# Patient Record
Sex: Female | Born: 1950 | Race: White | Hispanic: No | Marital: Married | Smoking: Never smoker
Health system: Southern US, Community
[De-identification: ages and names within clinical notes are randomized; demographics above are authoritative.]

## PROBLEM LIST (undated history)

## (undated) DIAGNOSIS — K219 Gastro-esophageal reflux disease without esophagitis: Secondary | ICD-10-CM

## (undated) DIAGNOSIS — T7840XA Allergy, unspecified, initial encounter: Secondary | ICD-10-CM

## (undated) DIAGNOSIS — K5790 Diverticulosis of intestine, part unspecified, without perforation or abscess without bleeding: Secondary | ICD-10-CM

## (undated) DIAGNOSIS — K635 Polyp of colon: Secondary | ICD-10-CM

## (undated) DIAGNOSIS — E669 Obesity, unspecified: Secondary | ICD-10-CM

## (undated) DIAGNOSIS — E785 Hyperlipidemia, unspecified: Secondary | ICD-10-CM

## (undated) HISTORY — DX: Allergy, unspecified, initial encounter: T78.40XA

## (undated) HISTORY — PX: COLONOSCOPY: SHX174

## (undated) HISTORY — DX: Diverticulosis of intestine, part unspecified, without perforation or abscess without bleeding: K57.90

## (undated) HISTORY — DX: Hyperlipidemia, unspecified: E78.5

## (undated) HISTORY — DX: Obesity, unspecified: E66.9

## (undated) HISTORY — DX: Polyp of colon: K63.5

## (undated) HISTORY — PX: CHOLECYSTECTOMY: SHX55

## (undated) HISTORY — PX: THYROID CYST EXCISION: SHX2511

## (undated) HISTORY — DX: Gastro-esophageal reflux disease without esophagitis: K21.9

---

## 2005-05-06 ENCOUNTER — Ambulatory Visit: Payer: Self-pay | Admitting: Family Medicine

## 2005-08-11 ENCOUNTER — Ambulatory Visit: Payer: Self-pay | Admitting: Family Medicine

## 2005-08-14 ENCOUNTER — Ambulatory Visit: Payer: Self-pay | Admitting: Family Medicine

## 2005-10-01 ENCOUNTER — Ambulatory Visit (HOSPITAL_COMMUNITY): Admission: RE | Admit: 2005-10-01 | Discharge: 2005-10-01 | Payer: Self-pay | Admitting: Obstetrics and Gynecology

## 2006-09-21 ENCOUNTER — Ambulatory Visit: Payer: Self-pay | Admitting: Family Medicine

## 2006-10-05 ENCOUNTER — Ambulatory Visit (HOSPITAL_COMMUNITY): Admission: RE | Admit: 2006-10-05 | Discharge: 2006-10-05 | Payer: Self-pay | Admitting: Obstetrics and Gynecology

## 2006-10-20 ENCOUNTER — Encounter: Admission: RE | Admit: 2006-10-20 | Discharge: 2006-10-20 | Payer: Self-pay | Admitting: Obstetrics and Gynecology

## 2007-10-10 ENCOUNTER — Ambulatory Visit (HOSPITAL_COMMUNITY): Admission: RE | Admit: 2007-10-10 | Discharge: 2007-10-10 | Payer: Self-pay | Admitting: Obstetrics and Gynecology

## 2008-10-11 ENCOUNTER — Ambulatory Visit (HOSPITAL_COMMUNITY): Admission: RE | Admit: 2008-10-11 | Discharge: 2008-10-11 | Payer: Self-pay | Admitting: Obstetrics and Gynecology

## 2009-11-11 ENCOUNTER — Ambulatory Visit (HOSPITAL_COMMUNITY): Admission: RE | Admit: 2009-11-11 | Discharge: 2009-11-11 | Payer: Self-pay | Admitting: Obstetrics and Gynecology

## 2010-11-16 ENCOUNTER — Encounter: Payer: Self-pay | Admitting: Obstetrics and Gynecology

## 2010-11-21 ENCOUNTER — Other Ambulatory Visit (HOSPITAL_COMMUNITY): Payer: Self-pay | Admitting: Family Medicine

## 2010-11-21 DIAGNOSIS — Z0000 Encounter for general adult medical examination without abnormal findings: Secondary | ICD-10-CM

## 2010-12-02 ENCOUNTER — Ambulatory Visit (HOSPITAL_COMMUNITY)
Admission: RE | Admit: 2010-12-02 | Discharge: 2010-12-02 | Disposition: A | Discharge status: Home / Self Care | Source: Ambulatory Visit | Attending: Family Medicine | Admitting: Family Medicine

## 2010-12-02 DIAGNOSIS — Z1231 Encounter for screening mammogram for malignant neoplasm of breast: Secondary | ICD-10-CM | POA: Insufficient documentation

## 2010-12-02 DIAGNOSIS — Z0000 Encounter for general adult medical examination without abnormal findings: Secondary | ICD-10-CM

## 2011-12-14 ENCOUNTER — Other Ambulatory Visit (HOSPITAL_COMMUNITY): Payer: Self-pay | Admitting: Family Medicine

## 2011-12-14 DIAGNOSIS — Z1231 Encounter for screening mammogram for malignant neoplasm of breast: Secondary | ICD-10-CM

## 2012-01-07 ENCOUNTER — Ambulatory Visit (HOSPITAL_COMMUNITY)
Admission: RE | Admit: 2012-01-07 | Discharge: 2012-01-07 | Disposition: A | Discharge status: Home / Self Care | Source: Ambulatory Visit | Attending: Family Medicine | Admitting: Family Medicine

## 2012-01-07 DIAGNOSIS — Z1231 Encounter for screening mammogram for malignant neoplasm of breast: Secondary | ICD-10-CM

## 2013-02-20 ENCOUNTER — Other Ambulatory Visit (HOSPITAL_COMMUNITY): Payer: Self-pay | Admitting: Family Medicine

## 2013-02-20 DIAGNOSIS — Z1231 Encounter for screening mammogram for malignant neoplasm of breast: Secondary | ICD-10-CM

## 2013-03-02 ENCOUNTER — Ambulatory Visit (HOSPITAL_COMMUNITY): Payer: PRIVATE HEALTH INSURANCE

## 2013-03-03 ENCOUNTER — Ambulatory Visit (HOSPITAL_COMMUNITY)
Admission: RE | Admit: 2013-03-03 | Discharge: 2013-03-03 | Disposition: A | Discharge status: Home / Self Care | Source: Ambulatory Visit | Attending: Family Medicine | Admitting: Family Medicine

## 2013-03-03 DIAGNOSIS — Z1231 Encounter for screening mammogram for malignant neoplasm of breast: Secondary | ICD-10-CM | POA: Insufficient documentation

## 2013-03-06 ENCOUNTER — Other Ambulatory Visit: Payer: Self-pay | Admitting: Family Medicine

## 2013-03-06 DIAGNOSIS — R928 Other abnormal and inconclusive findings on diagnostic imaging of breast: Secondary | ICD-10-CM

## 2013-03-16 ENCOUNTER — Ambulatory Visit
Admission: RE | Admit: 2013-03-16 | Discharge: 2013-03-16 | Disposition: A | Discharge status: Home / Self Care | Payer: PRIVATE HEALTH INSURANCE | Source: Ambulatory Visit | Attending: Family Medicine | Admitting: Family Medicine

## 2013-03-16 DIAGNOSIS — R928 Other abnormal and inconclusive findings on diagnostic imaging of breast: Secondary | ICD-10-CM

## 2013-08-16 ENCOUNTER — Other Ambulatory Visit: Payer: Self-pay | Admitting: Family Medicine

## 2013-08-16 DIAGNOSIS — N6310 Unspecified lump in the right breast, unspecified quadrant: Secondary | ICD-10-CM

## 2013-09-18 ENCOUNTER — Ambulatory Visit
Admission: RE | Admit: 2013-09-18 | Discharge: 2013-09-18 | Disposition: A | Discharge status: Home / Self Care | Source: Ambulatory Visit | Attending: Family Medicine | Admitting: Family Medicine

## 2013-09-18 DIAGNOSIS — N6310 Unspecified lump in the right breast, unspecified quadrant: Secondary | ICD-10-CM

## 2014-02-28 ENCOUNTER — Other Ambulatory Visit: Payer: Self-pay | Admitting: Family Medicine

## 2014-02-28 DIAGNOSIS — N6310 Unspecified lump in the right breast, unspecified quadrant: Secondary | ICD-10-CM

## 2014-03-12 ENCOUNTER — Ambulatory Visit
Admission: RE | Admit: 2014-03-12 | Discharge: 2014-03-12 | Disposition: A | Discharge status: Home / Self Care | Source: Ambulatory Visit | Attending: Family Medicine | Admitting: Family Medicine

## 2014-03-12 ENCOUNTER — Encounter (INDEPENDENT_AMBULATORY_CARE_PROVIDER_SITE_OTHER): Payer: Self-pay

## 2014-03-12 DIAGNOSIS — N6310 Unspecified lump in the right breast, unspecified quadrant: Secondary | ICD-10-CM

## 2015-04-22 ENCOUNTER — Other Ambulatory Visit: Payer: Self-pay

## 2015-04-22 DIAGNOSIS — Z1231 Encounter for screening mammogram for malignant neoplasm of breast: Secondary | ICD-10-CM

## 2015-05-01 ENCOUNTER — Ambulatory Visit
Admission: RE | Admit: 2015-05-01 | Discharge: 2015-05-01 | Disposition: A | Discharge status: Home / Self Care | Source: Ambulatory Visit

## 2015-05-01 DIAGNOSIS — Z1231 Encounter for screening mammogram for malignant neoplasm of breast: Secondary | ICD-10-CM

## 2015-07-15 DIAGNOSIS — E785 Hyperlipidemia, unspecified: Secondary | ICD-10-CM | POA: Insufficient documentation

## 2015-07-15 DIAGNOSIS — E010 Iodine-deficiency related diffuse (endemic) goiter: Secondary | ICD-10-CM | POA: Insufficient documentation

## 2015-07-15 DIAGNOSIS — E669 Obesity, unspecified: Secondary | ICD-10-CM | POA: Insufficient documentation

## 2015-07-15 DIAGNOSIS — R7309 Other abnormal glucose: Secondary | ICD-10-CM | POA: Insufficient documentation

## 2016-08-28 ENCOUNTER — Other Ambulatory Visit: Payer: Self-pay | Admitting: Family Medicine

## 2016-08-28 DIAGNOSIS — Z1231 Encounter for screening mammogram for malignant neoplasm of breast: Secondary | ICD-10-CM

## 2016-09-28 ENCOUNTER — Ambulatory Visit
Admission: RE | Admit: 2016-09-28 | Discharge: 2016-09-28 | Disposition: A | Discharge status: Home / Self Care | Payer: Medicare Other | Source: Ambulatory Visit | Attending: Family Medicine | Admitting: Family Medicine

## 2016-09-28 DIAGNOSIS — Z1231 Encounter for screening mammogram for malignant neoplasm of breast: Secondary | ICD-10-CM

## 2016-12-14 ENCOUNTER — Telehealth: Payer: Self-pay | Admitting: Gastroenterology

## 2016-12-14 ENCOUNTER — Encounter: Payer: Self-pay | Admitting: Gastroenterology

## 2017-01-20 ENCOUNTER — Ambulatory Visit: Payer: Medicare Other | Admitting: Gastroenterology

## 2017-02-04 ENCOUNTER — Encounter: Payer: Self-pay | Admitting: Gastroenterology

## 2017-02-04 ENCOUNTER — Ambulatory Visit (INDEPENDENT_AMBULATORY_CARE_PROVIDER_SITE_OTHER): Payer: Medicare Other | Admitting: Gastroenterology

## 2017-02-04 ENCOUNTER — Encounter (INDEPENDENT_AMBULATORY_CARE_PROVIDER_SITE_OTHER): Payer: Self-pay

## 2017-02-04 VITALS — BP 116/68 | HR 80 | Ht 64.0 in | Wt 211.0 lb

## 2017-02-04 DIAGNOSIS — K625 Hemorrhage of anus and rectum: Secondary | ICD-10-CM | POA: Diagnosis not present

## 2017-02-04 MED ORDER — NA SULFATE-K SULFATE-MG SULF 17.5-3.13-1.6 GM/177ML PO SOLN: 1.0000 | Freq: Once | ORAL | 0 refills | Status: AC

## 2017-02-04 NOTE — Progress Notes (Signed)
Joyce Jenkins    [id number]    1951/01/17  Primary Care Physician:NYLAND,LEONARD Joyce [place], MD  Referring Physician: Bridget Hartshorn, NP [address] [place], [place] [address]  Chief complaint:  Blood per rectum  HPI: 89 yr F here for new patient visit with complaints of intermittent rectal bleeding since December 2017. She notices bright red blood per rectum after bowel movement when she wipes intermittently. She has lower abdominal discomfort sometimes after bowel movement. Denies any rectal pain or blood mixed in stool. No nausea or vomiting. Patient is very concerned about rectal bleeding. Denies any recent change in bowel habits, she has regular bowel movements. Family history positive for polyps precancer but no history of cancer.  Last colonoscopy in 2013 with removal of 2 diminutive polyps in left colon, hyperplastic on pathology.    Outpatient Encounter Prescriptions as of 02/04/2017  Medication Sig  . cetirizine (ZYRTEC) 10 MG tablet Take 10 mg by mouth daily.  . Cholecalciferol (VITAMIN D3) 10000 units TABS Take 1 tablet by mouth daily.  . Coenzyme Q10 (CO Q-10) 200 MG CAPS Take 1 capsule by mouth daily.  . Multiple Vitamin (MULTIVITAMIN) tablet Take 1 tablet by mouth daily.  . pravastatin (PRAVACHOL) 40 MG tablet Take 40 mg by mouth daily.  . Na Sulfate-K Sulfate-Mg Sulf (SUPREP BOWEL PREP KIT) 17.5-3.13-1.6 GM/180ML SOLN Take 1 kit by mouth once.   No facility-administered encounter medications on file as of 02/04/2017.     Allergies as of 02/04/2017 - Review Complete 02/04/2017  Allergen Reaction Noted  . Aspirin Swelling 02/04/2017    Past Medical History:  Diagnosis Date  . Colon polyps   . Diverticulosis   . Hyperlipidemia   . Obesity     Past Surgical History:  Procedure Laterality Date  . CHOLECYSTECTOMY      Family History  Problem Relation Age of Onset  . Colon polyps Mother   . Diabetes Paternal Grandmother     Social  History   Social History  . Marital status: Married    Spouse name: N/A  . Number of children: N/A  . Years of education: N/A   Occupational History  . Not on file.   Social History Main Topics  . Smoking status: Not on file  . Smokeless tobacco: Not on file  . Alcohol use Not on file  . Drug use: Unknown  . Sexual activity: Not on file   Other Topics Concern  . Not on file   Social History Narrative  . No narrative on file      Review of systems: Review of Systems  Constitutional: Negative for fever and chills.  HENT: Negative.   Eyes: Negative for blurred vision.  Respiratory: Negative for cough, shortness of breath and wheezing.   Cardiovascular: Negative for chest pain and palpitations.  Gastrointestinal: as per HPI Genitourinary: Negative for dysuria, urgency, frequency and hematuria.  Musculoskeletal: Positive for myalgias, back pain and joint pain.  Skin: Negative for itching and rash.  Neurological: Negative for dizziness, tremors, focal weakness, seizures and loss of consciousness.  Endo/Heme/Allergies: Positive for seasonal allergies.  Psychiatric/Behavioral: Negative for depression, suicidal ideas and hallucinations.  All other systems reviewed and are negative.   Physical Exam: Vitals:   02/04/17 0843  BP: 116/68  Pulse: 80   Body mass index is 36.22 kg/m. Gen:      No acute distress HEENT:  EOMI, sclera anicteric Neck:     No masses;  no thyromegaly Lungs:    Clear to auscultation bilaterally; normal respiratory effort CV:         Regular rate and rhythm; no murmurs Abd:      + bowel sounds; soft, non-tender; no palpable masses, no distension Ext:    No edema; adequate peripheral perfusion Skin:      Warm and dry; no rash Neuro: alert and oriented x 3 Psych: normal mood and affect Rectal exam: Normal anal sphincter tone, no anal fissure or external hemorrhoids Anoscopy: Small grade 2 internal hemorrhoids, no active bleeding, normal dentate  line. Stool in the rectal vault with limited visibility  Data Reviewed:  Reviewed labs, radiology imaging, old records and pertinent past GI work up   Assessment and Plan/Recommendations:  66 year old female here with complaints of intermittent rectal bleeding here for evaluation The patient is anxious and very concerned with recent onset rectal bleeding Last colonoscopy in 2013 with removal of diminutive hyperplastic polyps Limited anoscopy due to stool in the rectal vault Most likely etiology hemorrhoidal hemorrhage given small-volume intermittent bleeding but cannot exclude rectal ulcer, bleeding inflammatory polyp or malignancy We'll schedule for colonoscopy for evaluation of rectal bleeding The risks and benefits as well as alternatives of endoscopic procedure(s) have been discussed and reviewed. All questions answered. The patient agrees to proceed.  Status post colonoscopy if continues to have persistent rectal bleeding and has no other significant pathology other than hemorrhoids, will consider hemorrhoidal band ligation for treatment    K. Denzil Magnuson , MD [phone] Mon-Fri 8a-5p [phone] after 5p, weekends, holidays  CC: H[place]g, Karie Schwalbe, NP

## 2017-02-04 NOTE — Patient Instructions (Signed)

## 2017-02-22 ENCOUNTER — Encounter: Payer: Self-pay | Admitting: Gastroenterology

## 2017-02-22 ENCOUNTER — Ambulatory Visit (AMBULATORY_SURGERY_CENTER): Payer: Medicare Other | Admitting: Gastroenterology

## 2017-02-22 VITALS — BP 127/66 | HR 74 | Temp 98.4°F | Resp 12 | Ht 64.0 in | Wt 211.0 lb

## 2017-02-22 DIAGNOSIS — K625 Hemorrhage of anus and rectum: Secondary | ICD-10-CM | POA: Diagnosis not present

## 2017-02-22 DIAGNOSIS — D120 Benign neoplasm of cecum: Secondary | ICD-10-CM | POA: Diagnosis not present

## 2017-02-22 DIAGNOSIS — D127 Benign neoplasm of rectosigmoid junction: Secondary | ICD-10-CM

## 2017-02-22 DIAGNOSIS — D125 Benign neoplasm of sigmoid colon: Secondary | ICD-10-CM

## 2017-02-22 DIAGNOSIS — K635 Polyp of colon: Secondary | ICD-10-CM

## 2017-02-22 MED ORDER — SODIUM CHLORIDE 0.9 % IV SOLN: 500.0000 mL | INTRAVENOUS | Status: DC

## 2017-02-22 NOTE — Progress Notes (Signed)
Called to room to assist during endoscopic procedure.  Patient ID and intended procedure confirmed with present staff. Received instructions for my participation in the procedure from the performing physician.  

## 2017-02-22 NOTE — Patient Instructions (Signed)
YOU HAD AN ENDOSCOPIC PROCEDURE TODAY AT [facility]:   Refer to the procedure report that was given to you for any specific questions about what was found during the examination.  If the procedure report does not answer your questions, please call your gastroenterologist to clarify.  If you requested that your care partner not be given the details of your procedure findings, then the procedure report has been included in a sealed envelope for you to review at your convenience later.  YOU SHOULD EXPECT: Some feelings of bloating in the abdomen. Passage of more gas than usual.  Walking can help get rid of the air that was put into your GI tract during the procedure and reduce the bloating. If you had a lower endoscopy (such as a colonoscopy or flexible sigmoidoscopy) you may notice spotting of blood in your stool or on the toilet paper. If you underwent a bowel prep for your procedure, you may not have a normal bowel movement for a few days.  Please Note:  You might notice some irritation and congestion in your nose or some drainage.  This is from the oxygen used during your procedure.  There is no need for concern and it should clear up in a day or so.  SYMPTOMS TO REPORT IMMEDIATELY:   Following lower endoscopy (colonoscopy or flexible sigmoidoscopy):  Excessive amounts of blood in the stool  Significant tenderness or worsening of abdominal pains  Swelling of the abdomen that is new, acute  Fever of 100F or higher   For urgent or emergent issues, a gastroenterologist can be reached at any hour by calling [phone].   DIET:  We do recommend a small meal at first, but then you may proceed to your regular diet.  Drink plenty of fluids but you should avoid alcoholic beverages for 24 hours.  ACTIVITY:  You should plan to take it easy for the rest of today and you should NOT DRIVE or use heavy machinery until tomorrow (because of the sedation medicines used during the test).     FOLLOW UP: Our staff will call the number listed on your records the next business day following your procedure to check on you and address any questions or concerns that you may have regarding the information given to you following your procedure. If we do not reach you, we will leave a message.  However, if you are feeling well and you are not experiencing any problems, there is no need to return our call.  We will assume that you have returned to your regular daily activities without incident.  If any biopsies were taken you will be contacted by phone or by letter within the next 1-3 weeks.  Please call us at [phone] if you have not heard about the biopsies in 3 weeks.    SIGNATURES/CONFIDENTIALITY: You and/or your care partner have signed paperwork which will be entered into your electronic medical record.  These signatures attest to the fact that that the information above on your After Visit Summary has been reviewed and is understood.  Full responsibility of the confidentiality of this discharge information lies with you and/or your care-partner.    Handouts were given to your care partner on polyps, diverticulosis, and hemorrhoids. No aspirin, aspirin products,  ibuprofen, naproxen, advil, motrin, aleve, or other non-steroidal anti-inflammatory drugs for 14 days after polyp removal. You may resume your other current medications today. Clip placed in sigmoid colon x1.  Clip card given to your  care partner. Await biopsy results. Please call if any questions or concerns.

## 2017-02-22 NOTE — Op Note (Addendum)
[facility] Patient Name: Elaijah Munoz Procedure Date: 02/22/2017 10:40 AM MRN: [id number] Endoscopist: Mauri Pole , MD Age: 66 Referring MD:  Date of Birth: [birth year] Gender: Female Account #: [id number] Procedure:                Colonoscopy Indications:              Evaluation of unexplained GI bleeding, Last                            colonoscopy: 2013 Medicines:                Monitored Anesthesia Care Procedure:                Pre-Anesthesia Assessment:                           - Prior to the procedure, a History and Physical                            was performed, and patient medications and                            allergies were reviewed. The patient's tolerance of                            previous anesthesia was also reviewed. The risks                            and benefits of the procedure and the sedation                            options and risks were discussed with the patient.                            All questions were answered, and informed consent                            was obtained. Prior Anticoagulants: The patient has                            taken no previous anticoagulant or antiplatelet                            agents. ASA Grade Assessment: II - A patient with                            mild systemic disease. After reviewing the risks                            and benefits, the patient was deemed in                            satisfactory condition to undergo the procedure.  After obtaining informed consent, the colonoscope                            was passed under direct vision. Throughout the                            procedure, the patient's blood pressure, pulse, and                            oxygen saturations were monitored continuously. The                            Model PCF-H190DL [id number]) scope was introduced                            through the anus and advanced to the the  terminal                            ileum, with identification of the appendiceal                            orifice and IC valve. The colonoscopy was performed                            without difficulty. The patient tolerated the                            procedure well. The quality of the bowel                            preparation was excellent. The terminal ileum,                            ileocecal valve, appendiceal orifice, and rectum                            were photographed. Scope In: 10:56:43 AM Scope Out: 38:46:65 AM Scope Withdrawal Time: 0 hours 16 minutes 7 seconds  Total Procedure Duration: 0 hours 20 minutes 29 seconds  Findings:                 The perianal and digital rectal examinations were                            normal.                           A 5 mm polyp was found in the cecum. The polyp was                            sessile. The polyp was removed with a cold snare.                            Resection and retrieval were complete.  A 30 mm polyp was found in the sigmoid colon. The                            polyp was pedunculated. The polyp was removed with                            a hot snare. Resection and retrieval were complete.                            To prevent bleeding after the polypectomy, one                            hemostatic clip was successfully placed (MR                            conditional). There was no bleeding at the end of                            the procedure.                           A 8 mm polyp was found in the recto-sigmoid colon.                            The polyp was pedunculated. The polyp was removed                            with a hot snare. Resection and retrieval were                            complete.                           Multiple small and large-mouthed diverticula were                            found in the sigmoid colon, descending colon,                             transverse colon and ascending colon.                           Non-bleeding internal hemorrhoids were found during                            retroflexion. The hemorrhoids were medium-sized. Complications:            No immediate complications. Estimated Blood Loss:     Estimated blood loss was minimal. Impression:               - One 5 mm polyp in the cecum, removed with a cold                            snare. Resected and retrieved.                           -  One 30 mm polyp in the sigmoid colon, removed                            with a hot snare. Resected and retrieved. Clip (MR                            conditional) was placed.                           - One 8 mm polyp at the recto-sigmoid colon,                            removed with a hot snare. Resected and retrieved.                           - Moderate diverticulosis in the sigmoid colon, in                            the descending colon, in the transverse colon and                            in the ascending colon.                           - Non-bleeding internal hemorrhoids. Recommendation:           - Patient has a contact number available for                            emergencies. The signs and symptoms of potential                            delayed complications were discussed with the                            patient. Return to normal activities tomorrow.                            Written discharge instructions were provided to the                            patient.                           - Resume previous diet.                           - Continue present medications.                           - Await pathology results.                           - Repeat colonoscopy date to be determined after  pending pathology results are reviewed for                            surveillance based on pathology results.                           - Return to GI clinic PRN.                            - No aspirin, ibuprofen, naproxen, or other                            non-steroidal anti-inflammatory drugs for 2 weeks                            after polyp removal. Mauri Pole, MD 02/22/2017 11:22:54 AM This report has been signed electronically.

## 2017-02-22 NOTE — Progress Notes (Signed)
No problems noted in the recovery room. maw 

## 2017-02-22 NOTE — Progress Notes (Signed)
A/ox3, pleased with MAC, report to RN 

## 2017-02-22 NOTE — Progress Notes (Signed)
Pt. Reports no change in surgical or medical history since pre-visit 02/04/2017.

## 2017-02-23 ENCOUNTER — Telehealth: Payer: Self-pay

## 2017-02-23 NOTE — Telephone Encounter (Signed)
  Follow up Call-  Call back number 02/22/2017  Post procedure Call Back phone  # [phone]  Permission to leave phone message Yes  Some recent data might be hidden     Patient questions:  Do you have a fever, pain , or abdominal swelling? No. Pain Score  0 *  Have you tolerated food without any problems? Yes.    Have you been able to return to your normal activities? Yes.    Do you have any questions about your discharge instructions: Diet   No. Medications  No. Follow up visit  No.  Do you have questions or concerns about your Care? No.  Actions: * If pain score is 4 or above: No action needed, pain <4.  No problems noted per pt. maw

## 2017-03-02 ENCOUNTER — Encounter: Payer: Self-pay | Admitting: Gastroenterology

## 2017-03-23 DIAGNOSIS — E559 Vitamin D deficiency, unspecified: Secondary | ICD-10-CM | POA: Insufficient documentation

## 2017-03-23 DIAGNOSIS — Z780 Asymptomatic menopausal state: Secondary | ICD-10-CM | POA: Insufficient documentation

## 2017-09-13 ENCOUNTER — Other Ambulatory Visit: Payer: Self-pay | Admitting: Family Medicine

## 2017-09-13 DIAGNOSIS — Z1231 Encounter for screening mammogram for malignant neoplasm of breast: Secondary | ICD-10-CM

## 2017-11-01 ENCOUNTER — Ambulatory Visit
Admission: RE | Admit: 2017-11-01 | Discharge: 2017-11-01 | Disposition: A | Discharge status: Home / Self Care | Payer: Medicare Other | Source: Ambulatory Visit | Attending: Family Medicine | Admitting: Family Medicine

## 2017-11-01 DIAGNOSIS — Z1231 Encounter for screening mammogram for malignant neoplasm of breast: Secondary | ICD-10-CM

## 2018-01-24 ENCOUNTER — Encounter: Payer: Self-pay | Admitting: Gastroenterology

## 2018-02-03 ENCOUNTER — Ambulatory Visit (AMBULATORY_SURGERY_CENTER): Payer: Self-pay

## 2018-02-03 ENCOUNTER — Other Ambulatory Visit: Payer: Self-pay

## 2018-02-03 VITALS — Ht 64.0 in | Wt 215.8 lb

## 2018-02-03 DIAGNOSIS — Z86010 Personal history of colonic polyps: Secondary | ICD-10-CM

## 2018-02-03 MED ORDER — PEG-KCL-NACL-NASULF-NA ASC-C 140 G PO SOLR: 1.0000 | Freq: Once | ORAL | 0 refills | Status: AC

## 2018-02-03 NOTE — Progress Notes (Signed)
Denies allergies to eggs or soy products. Denies complication of anesthesia or sedation. Denies use of weight loss medication. Denies use of O2.   Emmi instructions declined.  

## 2018-02-16 ENCOUNTER — Encounter: Payer: Self-pay | Admitting: Gastroenterology

## 2018-02-16 ENCOUNTER — Ambulatory Visit (AMBULATORY_SURGERY_CENTER): Payer: Medicare Other | Admitting: Gastroenterology

## 2018-02-16 ENCOUNTER — Other Ambulatory Visit: Payer: Self-pay

## 2018-02-16 VITALS — BP 125/64 | HR 67 | Temp 98.6°F | Resp 27 | Ht 64.0 in | Wt 211.0 lb

## 2018-02-16 DIAGNOSIS — D125 Benign neoplasm of sigmoid colon: Secondary | ICD-10-CM

## 2018-02-16 DIAGNOSIS — K635 Polyp of colon: Secondary | ICD-10-CM | POA: Diagnosis not present

## 2018-02-16 DIAGNOSIS — Z86010 Personal history of colonic polyps: Secondary | ICD-10-CM | POA: Diagnosis not present

## 2018-02-16 MED ORDER — SODIUM CHLORIDE 0.9 % IV SOLN
500.0000 mL | Freq: Once | INTRAVENOUS | Status: DC
Start: 2018-02-16 — End: 2019-04-24

## 2018-02-16 NOTE — Op Note (Signed)
[facility] Patient Name: Joyce Jenkins Procedure Date: 02/16/2018 11:21 AM MRN: [id number] Endoscopist: Mauri Pole , MD Age: 67 Referring MD:  Date of Birth: February 15, 1951 Gender: Female Account #: [id number] Procedure:                Colonoscopy Indications:              High risk colon cancer surveillance: Personal                            history of colonic polyps, High risk colon cancer                            surveillance: Personal history of adenoma (10 mm or                            greater in size), High risk colon cancer                            surveillance: Personal history of adenoma with high                            grade dysplasia, High risk colon cancer                            surveillance: Personal history of multiple (3 or                            more) adenomas, Last colonoscopy: 2018 Medicines:                Monitored Anesthesia Care Procedure:                Pre-Anesthesia Assessment:                           - Prior to the procedure, a History and Physical                            was performed, and patient medications and                            allergies were reviewed. The patient's tolerance of                            previous anesthesia was also reviewed. The risks                            and benefits of the procedure and the sedation                            options and risks were discussed with the patient.                            All questions were answered, and informed consent  was obtained. Prior Anticoagulants: The patient has                            taken no previous anticoagulant or antiplatelet                            agents. ASA Grade Assessment: II - A patient with                            mild systemic disease. After reviewing the risks                            and benefits, the patient was deemed in                            satisfactory condition to undergo  the procedure.                           After obtaining informed consent, the colonoscope                            was passed under direct vision. Throughout the                            procedure, the patient's blood pressure, pulse, and                            oxygen saturations were monitored continuously. The                            Model PCF-H190DL [id number]) scope was introduced                            through the anus and advanced to the the cecum,                            identified by appendiceal orifice and ileocecal                            valve. The colonoscopy was performed without                            difficulty. The patient tolerated the procedure                            well. The quality of the bowel preparation was                            good. The ileocecal valve, appendiceal orifice, and                            rectum were photographed. Scope In: 11:24:31 AM Scope Out: 11:40:15 AM Scope Withdrawal Time: 0 hours 10 minutes 58 seconds  Total Procedure Duration: 0  hours 15 minutes 44 seconds  Findings:                 The perianal and digital rectal examinations were                            normal.                           A 2 mm polyp was found in the sigmoid colon. The                            polyp was sessile. The polyp was removed with a                            cold biopsy forceps. Resection and retrieval were                            complete.                           Scattered small and large-mouthed diverticula were                            found in the sigmoid colon, descending colon,                            transverse colon, ascending colon and cecum.                           Non-bleeding internal hemorrhoids were found during                            retroflexion. The hemorrhoids were medium-sized.                           The exam was otherwise without abnormality. Complications:            No immediate  complications. Estimated Blood Loss:     Estimated blood loss was minimal. Impression:               - One 2 mm polyp in the sigmoid colon, removed with                            a cold biopsy forceps. Resected and retrieved.                           - Mild diverticulosis in the sigmoid colon, in the                            descending colon, in the transverse colon, in the                            ascending colon and in the cecum.                           -  Non-bleeding internal hemorrhoids.                           - The examination was otherwise normal. Recommendation:           - Patient has a contact number available for                            emergencies. The signs and symptoms of potential                            delayed complications were discussed with the                            patient. Return to normal activities tomorrow.                            Written discharge instructions were provided to the                            patient.                           - Resume previous diet.                           - Continue present medications.                           - Await pathology results.                           - Repeat colonoscopy in 5 years for surveillance                            based on pathology results. Mauri Pole, MD 02/16/2018 11:46:37 AM This report has been signed electronically.

## 2018-02-16 NOTE — Progress Notes (Signed)
Report given to PACU, vss 

## 2018-02-16 NOTE — Progress Notes (Signed)
Pt's states no medical or surgical changes since previsit or office visit. 

## 2018-02-16 NOTE — Progress Notes (Signed)
Called to room to assist during endoscopic procedure.  Patient ID and intended procedure confirmed with present staff. Received instructions for my participation in the procedure from the performing physician.  

## 2018-02-16 NOTE — Patient Instructions (Signed)
*  Handout given to patient on polyps, diverticulosis and hemorrhoids.   YOU HAD AN ENDOSCOPIC PROCEDURE TODAY AT [facility]:   Refer to the procedure report that was given to you for any specific questions about what was found during the examination.  If the procedure report does not answer your questions, please call your gastroenterologist to clarify.  If you requested that your care partner not be given the details of your procedure findings, then the procedure report has been included in a sealed envelope for you to review at your convenience later.  YOU SHOULD EXPECT: Some feelings of bloating in the abdomen. Passage of more gas than usual.  Walking can help get rid of the air that was put into your GI tract during the procedure and reduce the bloating. If you had a lower endoscopy (such as a colonoscopy or flexible sigmoidoscopy) you may notice spotting of blood in your stool or on the toilet paper. If you underwent a bowel prep for your procedure, you may not have a normal bowel movement for a few days.  Please Note:  You might notice some irritation and congestion in your nose or some drainage.  This is from the oxygen used during your procedure.  There is no need for concern and it should clear up in a day or so.  SYMPTOMS TO REPORT IMMEDIATELY:   Following lower endoscopy (colonoscopy or flexible sigmoidoscopy):  Excessive amounts of blood in the stool  Significant tenderness or worsening of abdominal pains  Swelling of the abdomen that is new, acute  Fever of 100F or higher    For urgent or emergent issues, a gastroenterologist can be reached at any hour by calling [phone].   DIET:  We do recommend a small meal at first, but then you may proceed to your regular diet.  Drink plenty of fluids but you should avoid alcoholic beverages for 24 hours.  ACTIVITY:  You should plan to take it easy for the rest of today and you should NOT DRIVE or use heavy machinery  until tomorrow (because of the sedation medicines used during the test).    FOLLOW UP: Our staff will call the number listed on your records the next business day following your procedure to check on you and address any questions or concerns that you may have regarding the information given to you following your procedure. If we do not reach you, we will leave a message.  However, if you are feeling well and you are not experiencing any problems, there is no need to return our call.  We will assume that you have returned to your regular daily activities without incident.  If any biopsies were taken you will be contacted by phone or by letter within the next 1-3 weeks.  Please call us at [phone] if you have not heard about the biopsies in 3 weeks.    SIGNATURES/CONFIDENTIALITY: You and/or your care partner have signed paperwork which will be entered into your electronic medical record.  These signatures attest to the fact that that the information above on your After Visit Summary has been reviewed and is understood.  Full responsibility of the confidentiality of this discharge information lies with you and/or your care-partner.

## 2018-02-17 ENCOUNTER — Telehealth: Payer: Self-pay | Admitting: *Deleted

## 2018-02-17 NOTE — Telephone Encounter (Signed)
  Follow up Call-  Call back number 02/16/2018 02/22/2017  Post procedure Call Back phone  # [phone]  Permission to leave phone message Yes Yes  Some recent data might be hidden     Patient questions:  Do you have a fever, pain , or abdominal swelling? No. Pain Score  0 *  Have you tolerated food without any problems? No.  Have you been able to return to your normal activities? Yes.    Do you have any questions about your discharge instructions: Diet   No. Medications  No. Follow up visit  No.  Do you have questions or concerns about your Care? No.  Actions: * If pain score is 4 or above: No action needed, pain <4.

## 2018-02-25 ENCOUNTER — Encounter: Payer: Self-pay | Admitting: Gastroenterology

## 2018-07-25 NOTE — Telephone Encounter (Signed)
Colonoscopy done 02/16/17.

## 2018-12-08 ENCOUNTER — Other Ambulatory Visit: Payer: Self-pay | Admitting: Family Medicine

## 2018-12-08 DIAGNOSIS — Z1231 Encounter for screening mammogram for malignant neoplasm of breast: Secondary | ICD-10-CM

## 2018-12-14 ENCOUNTER — Ambulatory Visit
Admission: RE | Admit: 2018-12-14 | Discharge: 2018-12-14 | Disposition: A | Discharge status: Home / Self Care | Payer: Medicare Other | Source: Ambulatory Visit | Attending: Family Medicine | Admitting: Family Medicine

## 2018-12-14 DIAGNOSIS — Z1231 Encounter for screening mammogram for malignant neoplasm of breast: Secondary | ICD-10-CM

## 2019-04-14 ENCOUNTER — Ambulatory Visit: Payer: Self-pay | Admitting: Family Medicine

## 2019-04-24 ENCOUNTER — Other Ambulatory Visit: Payer: Self-pay

## 2019-04-25 ENCOUNTER — Encounter: Payer: Self-pay | Admitting: Family Medicine

## 2019-04-25 ENCOUNTER — Ambulatory Visit (INDEPENDENT_AMBULATORY_CARE_PROVIDER_SITE_OTHER): Payer: Medicare Other | Admitting: Family Medicine

## 2019-04-25 VITALS — BP 139/82 | HR 90 | Temp 97.4°F | Ht 64.0 in | Wt 224.4 lb

## 2019-04-25 DIAGNOSIS — Z7689 Persons encountering health services in other specified circumstances: Secondary | ICD-10-CM | POA: Diagnosis not present

## 2019-04-25 DIAGNOSIS — E010 Iodine-deficiency related diffuse (endemic) goiter: Secondary | ICD-10-CM

## 2019-04-25 DIAGNOSIS — Z1159 Encounter for screening for other viral diseases: Secondary | ICD-10-CM

## 2019-04-25 DIAGNOSIS — E669 Obesity, unspecified: Secondary | ICD-10-CM

## 2019-04-25 DIAGNOSIS — E785 Hyperlipidemia, unspecified: Secondary | ICD-10-CM

## 2019-04-25 DIAGNOSIS — E559 Vitamin D deficiency, unspecified: Secondary | ICD-10-CM

## 2019-04-25 DIAGNOSIS — R7309 Other abnormal glucose: Secondary | ICD-10-CM | POA: Diagnosis not present

## 2019-04-25 NOTE — Progress Notes (Signed)
Subjective: YH:CWCBJSEGB care, hyperlipidemia HPI: Joyce Jenkins is a 68 y.o. female presenting to clinic today for:  1.  Hyperlipidemia Patient reports that she is treated for hyperlipidemia pravastatin 40 mg daily.  No chest pain, shortness of breath.  She does occasionally have lower extremity edema.  She reports having failed dieting in the past.  She does not enjoy exercise.  2.  Preventative care Patient reports recent mammogram that was normal.  She goes to the breast center for this.  She had a colonoscopy that was abnormal about 2 years ago and therefore had a repeat last year.  She is now to be have colonoscopy every 3 years.  She not had a Pap smear in years.  Denies any abnormal vaginal symptoms.  Would like to have 1 more done.  Past Medical History:  Diagnosis Date  . Allergy   . Colon polyps   . Diverticulosis   . GERD (gastroesophageal reflux disease)   . Hyperlipidemia   . Obesity    Past Surgical History:  Procedure Laterality Date  . CHOLECYSTECTOMY    . THYROID CYST EXCISION     Social History   Socioeconomic History  . Marital status: Married    Spouse name: Not on file  . Number of children: 2  . Years of education: Not on file  . Highest education level: Not on file  Occupational History  . Occupation: Retired Tour manager  . Financial resource strain: Not on file  . Food insecurity    Worry: Not on file    Inability: Not on file  . Transportation needs    Medical: Not on file    Non-medical: Not on file  Tobacco Use  . Smoking status: Never Smoker  . Smokeless tobacco: Never Used  Substance and Sexual Activity  . Alcohol use: Yes    Comment: wine nightly  . Drug use: No  . Sexual activity: Not on file  Lifestyle  . Physical activity    Days per week: Not on file    Minutes per session: Not on file  . Stress: Not on file  Relationships  . Social Herbalist on phone: Not on file    Gets together: Not on file   Attends religious service: Not on file    Active member of club or organization: Not on file    Attends meetings of clubs or organizations: Not on file    Relationship status: Not on file  . Intimate partner violence    Fear of current or ex partner: Not on file    Emotionally abused: Not on file    Physically abused: Not on file    Forced sexual activity: Not on file  Other Topics Concern  . Not on file  Social History Narrative  . Not on file   Current Meds  Medication Sig  . cetirizine (ZYRTEC) 10 MG tablet Take 10 mg by mouth daily.  . Cholecalciferol (VITAMIN D3) 10000 units TABS Take 1 tablet by mouth daily.  . Coenzyme Q10 (CO Q-10) 200 MG CAPS Take 1 capsule by mouth daily.  . famotidine (PEPCID) 20 MG tablet Take by mouth.  . Multiple Vitamin (MULTIVITAMIN) tablet Take 1 tablet by mouth daily.  . pravastatin (PRAVACHOL) 40 MG tablet Take 40 mg by mouth daily.   Family History  Problem Relation Age of Onset  . Colon polyps Mother   . Atrial fibrillation Mother   . Diabetes Paternal Grandmother   .  Cancer Father        lung and kidney  . Heart disease Father   . Colon cancer Neg Hx   . Esophageal cancer Neg Hx   . Liver cancer Neg Hx   . Pancreatic cancer Neg Hx   . Rectal cancer Neg Hx   . Stomach cancer Neg Hx    Allergies  Allergen Reactions  . Salac [Salicylic Acid] Swelling  . Aspirin Swelling  . Ibuprofen Other (See Comments)    Pt. Was told not to take Ibuprofen, based on her anaphylactic reaction to ASA     Health Maintenance: Hep C screen, DEXA  ROS: Per HPI  Objective: Office vital signs reviewed. BP 139/82   Pulse 90   Temp (!) 97.4 F (36.3 C) (Oral)   Ht '5\' 4"'  (1.626 m)   Wt 224 lb 6.4 oz (101.8 kg)   BMI 38.52 kg/m   Physical Examination:  General: Awake, alert, well nourished, obese. No acute distress HEENT: Normal    Neck: No masses palpated. No lymphadenopathy; thyromegaly noted Cardio: regular rate and rhythm, S1S2 heard, no  murmurs appreciated Pulm: clear to auscultation bilaterally, no wheezes, rhonchi or rales; normal work of breathing on room air Extremities: warm, well perfused, No edema, cyanosis or clubbing; +2 pulses bilaterally Psych: Mood stable, speech normal, affect appropriate, pleasant and interactive  Assessment/ Plan: 68 y.o. female   1. Establishing care with new doctor, encounter for I reviewed her previous records and labs in the EMR.  Patient to schedule Pap smear.  We discussed that recommendations by acog is to have one every 3 to 5 years until age 27.  This should be her last one as long as things are normal.  2. Dyslipidemia Plan for fasting lipid and metabolic panel. - DZH29+JMEQ; Future - Lipid panel; Future  3. Elevated glucose - Bayer DCA Hb A1c Waived; Future  4. Vitamin D deficiency - VITAMIN D 25 Hydroxy (Vit-D Deficiency, Fractures); Future  5. Obesity (BMI 30-39.9) - TSH; Future  6. Thyromegaly - TSH; Future  7. Encounter for hepatitis C screening test for low risk patient - Hepatitis C antibody; Future   Kieli Golladay Windell [place], [place] [phone]

## 2019-09-11 ENCOUNTER — Ambulatory Visit: Payer: Medicare Other | Admitting: Family Medicine

## 2019-10-13 ENCOUNTER — Other Ambulatory Visit: Payer: Self-pay | Admitting: Family Medicine

## 2019-10-13 DIAGNOSIS — E785 Hyperlipidemia, unspecified: Secondary | ICD-10-CM

## 2019-10-13 MED ORDER — PRAVASTATIN SODIUM 40 MG PO TABS: 40.0000 mg | ORAL_TABLET | Freq: Every day | ORAL | 1 refills | Status: DC

## 2019-10-13 NOTE — Telephone Encounter (Signed)
What is the name of the medication? Joyce Jenkins (PRAVACHOL) 40 MG tablet  Have you contacted your pharmacy to request a refill? Yes and they said dr has not sent in request. Pt was seen in July and Dr Darnell Level was supposed to send in  Which pharmacy would you like this sent to[facility]   Patient notified that their request is being sent to the clinical staff for review and that they should receive a call once it is complete. If they do not receive a call within 24 hours they can check with their pharmacy or our office.

## 2019-10-13 NOTE — Telephone Encounter (Signed)
Ok to fill? Looks like she was to follow up and cancelled her appt. Please advise

## 2019-10-13 NOTE — Telephone Encounter (Signed)
Patient aware.

## 2019-10-13 NOTE — Telephone Encounter (Signed)
Needs repeat liver function tests and lipid panel.  Please have her schedule appt

## 2019-10-16 ENCOUNTER — Other Ambulatory Visit: Payer: Self-pay

## 2019-10-16 ENCOUNTER — Other Ambulatory Visit: Payer: Medicare Other

## 2019-10-16 DIAGNOSIS — E669 Obesity, unspecified: Secondary | ICD-10-CM

## 2019-10-16 DIAGNOSIS — E559 Vitamin D deficiency, unspecified: Secondary | ICD-10-CM

## 2019-10-16 DIAGNOSIS — E785 Hyperlipidemia, unspecified: Secondary | ICD-10-CM

## 2019-10-16 DIAGNOSIS — R7309 Other abnormal glucose: Secondary | ICD-10-CM

## 2019-10-16 DIAGNOSIS — Z1159 Encounter for screening for other viral diseases: Secondary | ICD-10-CM

## 2019-10-16 DIAGNOSIS — E010 Iodine-deficiency related diffuse (endemic) goiter: Secondary | ICD-10-CM

## 2019-10-16 LAB — BAYER DCA HB A1C WAIVED: HB A1C (BAYER DCA - WAIVED): 6 % (ref <7.0)

## 2019-10-17 LAB — LIPID PANEL
Chol/HDL Ratio: 3.5 ratio (ref 0.0–4.4)
Cholesterol, Total: 180 mg/dL (ref 100–199)
HDL: 51 mg/dL (ref >39)
LDL Chol Calc (NIH): 99 mg/dL (ref 0–99)
Triglycerides: 177 mg/dL — ABNORMAL HIGH (ref 0–149)
VLDL Cholesterol Cal: 30 mg/dL (ref 5–40)

## 2019-10-17 LAB — CMP14+EGFR
ALT: 35 IU/L — ABNORMAL HIGH (ref 0–32)
AST: 26 IU/L (ref 0–40)
Albumin/Globulin Ratio: 1.7 (ref 1.2–2.2)
Albumin: 4.1 g/dL (ref 3.8–4.8)
Alkaline Phosphatase: 104 IU/L (ref 39–117)
BUN/Creatinine Ratio: 16 (ref 12–28)
BUN: 13 mg/dL (ref 8–27)
Bilirubin Total: 0.6 mg/dL (ref 0.0–1.2)
CO2: 25 mmol/L (ref 20–29)
Calcium: 9.3 mg/dL (ref 8.7–10.3)
Chloride: 102 mmol/L (ref 96–106)
Creatinine, Ser: 0.81 mg/dL (ref 0.57–1.00)
GFR calc Af Amer: 86 mL/min/{1.73_m2} (ref >59)
GFR calc non Af Amer: 74 mL/min/{1.73_m2} (ref >59)
Globulin, Total: 2.4 g/dL (ref 1.5–4.5)
Glucose: 123 mg/dL — ABNORMAL HIGH (ref 65–99)
Potassium: 4.1 mmol/L (ref 3.5–5.2)
Sodium: 140 mmol/L (ref 134–144)
Total Protein: 6.5 g/dL (ref 6.0–8.5)

## 2019-10-17 LAB — VITAMIN D 25 HYDROXY (VIT D DEFICIENCY, FRACTURES): Vit D, 25-Hydroxy: 31.4 ng/mL (ref 30.0–100.0)

## 2019-10-17 LAB — TSH: TSH: 4.26 u[IU]/mL (ref 0.450–4.500)

## 2019-10-17 LAB — HEPATITIS C ANTIBODY: Hep C Virus Ab: <0.1 s/co ratio (ref 0.0–0.9)

## 2019-10-25 ENCOUNTER — Telehealth: Payer: Self-pay | Admitting: Family Medicine

## 2019-10-25 NOTE — Telephone Encounter (Signed)
Patient aware of results and states she has an apt with Dr. Darnell Level Monday and will discuss with her then.

## 2019-10-30 ENCOUNTER — Ambulatory Visit (INDEPENDENT_AMBULATORY_CARE_PROVIDER_SITE_OTHER): Payer: Medicare Other | Admitting: Family Medicine

## 2019-10-30 ENCOUNTER — Encounter: Payer: Self-pay | Admitting: Family Medicine

## 2019-10-30 ENCOUNTER — Other Ambulatory Visit: Payer: Self-pay

## 2019-10-30 VITALS — BP 130/85 | HR 88 | Temp 97.8°F | Ht 64.0 in | Wt 223.0 lb

## 2019-10-30 DIAGNOSIS — E785 Hyperlipidemia, unspecified: Secondary | ICD-10-CM | POA: Diagnosis not present

## 2019-10-30 DIAGNOSIS — R7989 Other specified abnormal findings of blood chemistry: Secondary | ICD-10-CM

## 2019-10-30 NOTE — Patient Instructions (Addendum)
Come in for fasting labs (no appt needed) in 3 months.  I will call you with results and we will decide if a change in cholesterol medication is needed.  Keep working on diet modification and exercise.  Continue to take Pravastatin.

## 2019-10-30 NOTE — Progress Notes (Signed)
Subjective: CC: Dyslipidemia PCP: Janora Norlander, DO ZJI:RCVELF Bigford is a 69 y.o. female presenting to clinic today for:  1.  Dyslipidemia History: Intolerance to simvastatin Patient is here to review her cholesterol results.  At last visit she was noted to have an ASCVD risk score of 9.8%.  We were considering switching her Pravachol to Lipitor.  She actually notes that she was out of her Pravachol for about 2 weeks prior to the blood check.  Additionally, since the cholesterol came back elevated she has significantly reduced carbohydrate consumption and increased physical activity.  She has had a 3 pound weight loss successfully and plans to continue this lifestyle modification.  Her liver function test, AST, was noted to be mildly elevated to 35.  This concerns her.  She is not had any abdominal pain, nausea, vomiting.   ROS: Per HPI  Allergies  Allergen Reactions  . Salac [Salicylic Acid] Swelling  . Aspirin Swelling  . Ibuprofen Other (See Comments)    Pt. Was told not to take Ibuprofen, based on her anaphylactic reaction to ASA   Past Medical History:  Diagnosis Date  . Allergy   . Colon polyps   . Diverticulosis   . GERD (gastroesophageal reflux disease)   . Hyperlipidemia   . Obesity     Current Outpatient Medications:  .  cetirizine (ZYRTEC) 10 MG tablet, Take 10 mg by mouth daily., Disp: , Rfl:  .  Cholecalciferol (VITAMIN D3) 10000 units TABS, Take 1 tablet by mouth daily., Disp: , Rfl:  .  Coenzyme Q10 (CO Q-10) 200 MG CAPS, Take 1 capsule by mouth daily., Disp: , Rfl:  .  famotidine (PEPCID) 20 MG tablet, Take by mouth., Disp: , Rfl:  .  Multiple Vitamin (MULTIVITAMIN) tablet, Take 1 tablet by mouth daily., Disp: , Rfl:  .  pravastatin (PRAVACHOL) 40 MG tablet, Take 1 tablet (40 mg total) by mouth daily., Disp: 90 tablet, Rfl: 1 Social History   Socioeconomic History  . Marital status: Married    Spouse name: Not on file  . Number of children: 2  .  Years of education: Not on file  . Highest education level: Not on file  Occupational History  . Occupation: Retired Pharmacist, hospital  Tobacco Use  . Smoking status: Never Smoker  . Smokeless tobacco: Never Used  Substance and Sexual Activity  . Alcohol use: Yes    Alcohol/week: 2.0 standard drinks    Types: 2 Glasses of wine per week  . Drug use: No  . Sexual activity: Not on file  Other Topics Concern  . Not on file  Social History Narrative  . Not on file   Social Determinants of Health   Financial Resource Strain:   . Difficulty of Paying Living Expenses: Not on file  Food Insecurity:   . Worried About Charity fundraiser in the Last Year: Not on file  . Ran Out of Food in the Last Year: Not on file  Transportation Needs:   . Lack of Transportation (Medical): Not on file  . Lack of Transportation (Non-Medical): Not on file  Physical Activity:   . Days of Exercise per Week: Not on file  . Minutes of Exercise per Session: Not on file  Stress:   . Feeling of Stress : Not on file  Social Connections:   . Frequency of Communication with Friends and Family: Not on file  . Frequency of Social Gatherings with Friends and Family: Not on file  .  Attends Religious Services: Not on file  . Active Member of Clubs or Organizations: Not on file  . Attends Archivist Meetings: Not on file  . Marital Status: Not on file  Intimate Partner Violence:   . Fear of Current or Ex-Partner: Not on file  . Emotionally Abused: Not on file  . Physically Abused: Not on file  . Sexually Abused: Not on file   Family History  Problem Relation Age of Onset  . Colon polyps Mother   . Atrial fibrillation Mother   . Diabetes Paternal Grandmother   . Cancer Father        lung and kidney  . Heart disease Father   . Colon cancer Neg Hx   . Esophageal cancer Neg Hx   . Liver cancer Neg Hx   . Pancreatic cancer Neg Hx   . Rectal cancer Neg Hx   . Stomach cancer Neg Hx     Objective: Office  vital signs reviewed. BP 130/85   Pulse 88   Temp 97.8 F (36.6 C) (Temporal)   Ht _0  (1.626 m)   Wt 223 lb (101.2 kg)   SpO2 98%   BMI 38.28 kg/m   Physical Examination:  General: Awake, alert, obese, No acute distress HEENT: Normal; sclera white.  No carotid bruits Cardio: regular rate and rhythm, S1S2 heard, no murmurs appreciated Pulm: clear to auscultation bilaterally, no wheezes, rhonchi or rales; normal work of breathing on room air GI: obese. soft, non-tender, non-distended, bowel sounds present x4, no hepatomegaly, no splenomegaly, no masses  The 10-year ASCVD risk score Mikey Bussing DC Jr., et al., 2013) is: 8.6%   Values used to calculate the score:     Age: 13 years     Sex: Female     Is Non-Hispanic African American: No     Diabetic: No     Tobacco smoker: No     Systolic Blood Pressure: 597 mmHg     Is BP treated: No     HDL Cholesterol: 51 mg/dL     Total Cholesterol: 180 mg/dL  Assessment/ Plan: 69 y.o. female   1. Dyslipidemia Her recalculate ASCVD risk over today's blood pressure was noted to be 8.6%.  This is still above average risk.  However, she is successfully making lifestyle modifications.  I think that a 64-monthallowance for lifestyle changes is reasonable.  I would like her to continue pravastatin.  She will come in for fasting lipid panel in 3 months.  If her score is still above average, we will plan to transition to Lipitor 20 mg daily. - Lipid panel; Future - CMP14+EGFR; Future  2. Elevated liver function tests Likely secondary to fatty liver.  Will check CMP at next visit - Lipid panel; Future - CMP14+EGFR; Future  3. Morbid obesity ([place] Making lifestyle modifications.  A1c noted to be in prediabetic range at 6.0.   Orders Placed This Encounter  Procedures  . Lipid panel    Standing Status:   Future    Standing Expiration Date:   10/29/2020  . CMP14+EGFR    Standing Status:   Future    Standing Expiration Date:   10/29/2020  . Bayer  DCA Hb A1c Waived    Standing Status:   Future    Standing Expiration Date:   10/29/2020   No orders of the defined types were placed in this encounter.    AJanora Norlander DO [place]([phone]

## 2019-12-21 ENCOUNTER — Other Ambulatory Visit: Payer: Self-pay | Admitting: Family Medicine

## 2019-12-21 DIAGNOSIS — Z1231 Encounter for screening mammogram for malignant neoplasm of breast: Secondary | ICD-10-CM

## 2019-12-25 ENCOUNTER — Other Ambulatory Visit: Payer: Self-pay

## 2019-12-25 ENCOUNTER — Telehealth: Payer: Self-pay

## 2019-12-25 NOTE — Telephone Encounter (Signed)
Appointment scheduled.

## 2019-12-25 NOTE — Telephone Encounter (Signed)
Left leg/calf  Last 2 toes feel numb on same leg  Started Friday evening  No pain yesterday at all. During the night started back.  Denies injury.  Some sciatic pain in am that comes and goes on same side. This is not new. Has seen a chiropractor in past.  Tylenol before bed helped pt sleep during the night.

## 2019-12-25 NOTE — Telephone Encounter (Signed)
Please place her on a same day visit.

## 2019-12-26 ENCOUNTER — Encounter: Payer: Self-pay | Admitting: Family Medicine

## 2019-12-26 ENCOUNTER — Ambulatory Visit
Admission: RE | Admit: 2019-12-26 | Discharge: 2019-12-26 | Disposition: A | Discharge status: Home / Self Care | Payer: Medicare Other | Source: Ambulatory Visit | Attending: Family Medicine | Admitting: Family Medicine

## 2019-12-26 ENCOUNTER — Ambulatory Visit (INDEPENDENT_AMBULATORY_CARE_PROVIDER_SITE_OTHER): Payer: Medicare Other | Admitting: Family Medicine

## 2019-12-26 ENCOUNTER — Ambulatory Visit: Payer: Medicare Other | Admitting: Family Medicine

## 2019-12-26 VITALS — BP 132/70 | HR 77 | Temp 97.5°F | Ht 64.0 in | Wt 216.6 lb

## 2019-12-26 DIAGNOSIS — M5432 Sciatica, left side: Secondary | ICD-10-CM | POA: Diagnosis not present

## 2019-12-26 DIAGNOSIS — Z1231 Encounter for screening mammogram for malignant neoplasm of breast: Secondary | ICD-10-CM

## 2019-12-26 MED ORDER — PREDNISONE 10 MG (21) PO TBPK: ORAL_TABLET | ORAL | 0 refills | Status: DC

## 2019-12-26 NOTE — Progress Notes (Signed)
Assessment & Plan:  1. Left sciatic nerve pain - Education provided on sciatica. - predniSONE (STERAPRED UNI-PAK 21 TAB) 10 MG (21) TBPK tablet; As directed x 6 days  Dispense: 21 tablet; Refill: 0   Follow up plan: Return if symptoms worsen or fail to improve.  Hendricks Limes, MSN, APRN, FNP-C [facility]  Subjective:   Patient ID: Joyce Jenkins, female    DOB: 1951/02/16, 69 y.o.   MRN: [id number]  HPI: Joyce Jenkins is a 69 y.o. female presenting on 12/26/2019 for Leg Pain (left leg. Patient states since friday she has been having on and off shooting left calf pain.  States sometimes it makes her toes go numb )  Patient complains of a hot searing, shooting pain in her left calf.  This has been occurring on and off for the past 4 days.  It only last for about a second when it comes.  It does also cause her fourth and fifth toes on the left foot to go numb.  She reports she has low back pain every morning but is able to stretch it out and be okay throughout the day.  She does report sciatic flares 1-2 times a year and reports her sciatica was acting up the day that this started.   ROS: Negative unless specifically indicated above in HPI.   Relevant past medical history reviewed and updated as indicated.   Allergies and medications reviewed and updated.   Current Outpatient Medications:  .  cetirizine (ZYRTEC) 10 MG tablet, Take 10 mg by mouth daily., Disp: , Rfl:  .  Cholecalciferol (VITAMIN D3) 10000 units TABS, Take 1 tablet by mouth daily., Disp: , Rfl:  .  Coenzyme Q10 (CO Q-10) 200 MG CAPS, Take 1 capsule by mouth daily., Disp: , Rfl:  .  famotidine (PEPCID) 20 MG tablet, Take by mouth., Disp: , Rfl:  .  Multiple Vitamin (MULTIVITAMIN) tablet, Take 1 tablet by mouth daily., Disp: , Rfl:  .  pravastatin (PRAVACHOL) 40 MG tablet, Take 1 tablet (40 mg total) by mouth daily., Disp: 90 tablet, Rfl: 1 .  predniSONE (STERAPRED UNI-PAK 21 TAB) 10 MG (21) TBPK  tablet, As directed x 6 days, Disp: 21 tablet, Rfl: 0  Allergies  Allergen Reactions  . Salac [Salicylic Acid] Swelling  . Aspirin Swelling  . Ibuprofen Other (See Comments)    Pt. Was told not to take Ibuprofen, based on her anaphylactic reaction to ASA    Objective:   BP 132/70 Comment: manual  Pulse 77   Temp (!) 97.5 F (36.4 C) (Temporal)   Ht 5\' 4"  (1.626 m)   Wt 216 lb 9.6 oz (98.2 kg)   SpO2 100%   BMI 37.18 kg/m    Physical Exam Vitals reviewed.  Constitutional:      General: She is not in acute distress.    Appearance: Normal appearance. She is not ill-appearing, toxic-appearing or diaphoretic.  HENT:     Head: Normocephalic and atraumatic.  Eyes:     General: No scleral icterus.       Right eye: No discharge.        Left eye: No discharge.     Conjunctiva/sclera: Conjunctivae normal.  Cardiovascular:     Rate and Rhythm: Normal rate.  Pulmonary:     Effort: Pulmonary effort is normal. No respiratory distress.  Musculoskeletal:        General: Normal range of motion.     Cervical back: Normal range of motion.  Skin:    General: Skin is warm and dry.     Capillary Refill: Capillary refill takes less than 2 seconds.  Neurological:     General: No focal deficit present.     Mental Status: She is alert and oriented to person, place, and time. Mental status is at baseline.  Psychiatric:        Mood and Affect: Mood normal.        Behavior: Behavior normal.        Thought Content: Thought content normal.        Judgment: Judgment normal.

## 2019-12-26 NOTE — Patient Instructions (Signed)
Sciatica  Sciatica is pain, weakness, tingling, or loss of feeling (numbness) along the sciatic nerve. The sciatic nerve starts in the lower back and goes down the back of each leg. Sciatica usually goes away on its own or with treatment. Sometimes, sciatica may come back (recur). What are the causes? This condition happens when the sciatic nerve is pinched or has pressure put on it. This may be the result of:  A disk in between the bones of the spine bulging out too far (herniated disk).  Changes in the spinal disks that occur with aging.  A condition that affects a muscle in the butt.  Extra bone growth near the sciatic nerve.  A break (fracture) of the area between your hip bones (pelvis).  Pregnancy.  Tumor. This is rare. What increases the risk? You are more likely to develop this condition if you:  Play sports that put pressure or stress on the spine.  Have poor strength and ease of movement (flexibility).  Have had a back injury in the past.  Have had back surgery.  Sit for long periods of time.  Do activities that involve bending or lifting over and over again.  Are very overweight (obese). What are the signs or symptoms? Symptoms can vary from mild to very bad. They may include:  Any of these problems in the lower back, leg, hip, or butt: ? Mild tingling, loss of feeling, or dull aches. ? Burning sensations. ? Sharp pains.  Loss of feeling in the back of the calf or the sole of the foot.  Leg weakness.  Very bad back pain that makes it hard to move. These symptoms may get worse when you cough, sneeze, or laugh. They may also get worse when you sit or stand for long periods of time. How is this treated? This condition often gets better without any treatment. However, treatment may include:  Changing or cutting back on physical activity when you have pain.  Doing exercises and stretching.  Putting ice or heat on the affected area.  Medicines that  help: ? To relieve pain and swelling. ? To relax your muscles.  Shots (injections) of medicines that help to relieve pain, irritation, and swelling.  Surgery. Follow these instructions at home: Medicines  Take over-the-counter and prescription medicines only as told by your doctor.  Ask your doctor if the medicine prescribed to you: ? Requires you to avoid driving or using heavy machinery. ? Can cause trouble pooping (constipation). You may need to take these steps to prevent or treat trouble pooping:  Drink enough fluids to keep your pee (urine) pale yellow.  Take over-the-counter or prescription medicines.  Eat foods that are high in fiber. These include beans, whole grains, and fresh fruits and vegetables.  Limit foods that are high in fat and sugar. These include fried or sweet foods. Managing pain      If told, put ice on the affected area. ? Put ice in a plastic bag. ? Place a towel between your skin and the bag. ? Leave the ice on for 20 minutes, 2-3 times a day.  If told, put heat on the affected area. Use the heat source that your doctor tells you to use, such as a moist heat pack or a heating pad. ? Place a towel between your skin and the heat source. ? Leave the heat on for 20-30 minutes. ? Remove the heat if your skin turns bright red. This is very important if you are   unable to feel pain, heat, or cold. You may have a greater risk of getting burned. Activity   Return to your normal activities as told by your doctor. Ask your doctor what activities are safe for you.  Avoid activities that make your symptoms worse.  Take short rests during the day. ? When you rest for a long time, do some physical activity or stretching between periods of rest. ? Avoid sitting for a long time without moving. Get up and move around at least one time each hour.  Exercise and stretch regularly, as told by your doctor.  Do not lift anything that is heavier than 10 lb (4.5 kg)  while you have symptoms of sciatica. ? Avoid lifting heavy things even when you do not have symptoms. ? Avoid lifting heavy things over and over.  When you lift objects, always lift in a way that is safe for your body. To do this, you should: ? Bend your knees. ? Keep the object close to your body. ? Avoid twisting. General instructions  Stay at a healthy weight.  Wear comfortable shoes that support your feet. Avoid wearing high heels.  Avoid sleeping on a mattress that is too soft or too hard. You might have less pain if you sleep on a mattress that is firm enough to support your back.  Keep all follow-up visits as told by your doctor. This is important. Contact a doctor if:  You have pain that: ? Wakes you up when you are sleeping. ? Gets worse when you lie down. ? Is worse than the pain you have had in the past. ? Lasts longer than 4 weeks.  You lose weight without trying. Get help right away if:  You cannot control when you pee (urinate) or poop (have a bowel movement).  You have weakness in any of these areas and it gets worse: ? Lower back. ? The area between your hip bones. ? Butt. ? Legs.  You have redness or swelling of your back.  You have a burning feeling when you pee. Summary  Sciatica is pain, weakness, tingling, or loss of feeling (numbness) along the sciatic nerve.  This condition happens when the sciatic nerve is pinched or has pressure put on it.  Sciatica can cause pain, tingling, or loss of feeling (numbness) in the lower back, legs, hips, and butt.  Treatment often includes rest, exercise, medicines, and putting ice or heat on the affected area. This information is not intended to replace advice given to you by your health care provider. Make sure you discuss any questions you have with your health care provider. Document Revised: 10/31/2018 Document Reviewed: 10/31/2018 Elsevier Patient Education  2020 [employer]  

## 2020-02-22 ENCOUNTER — Other Ambulatory Visit: Payer: Self-pay

## 2020-02-22 ENCOUNTER — Other Ambulatory Visit: Payer: Medicare Other

## 2020-02-22 DIAGNOSIS — E785 Hyperlipidemia, unspecified: Secondary | ICD-10-CM

## 2020-02-22 DIAGNOSIS — R7989 Other specified abnormal findings of blood chemistry: Secondary | ICD-10-CM

## 2020-02-22 LAB — BAYER DCA HB A1C WAIVED: HB A1C (BAYER DCA - WAIVED): 5.5 % (ref <7.0)

## 2020-02-23 LAB — CMP14+EGFR
ALT: 27 IU/L (ref 0–32)
AST: 25 IU/L (ref 0–40)
Albumin/Globulin Ratio: 1.4 (ref 1.2–2.2)
Albumin: 3.7 g/dL — ABNORMAL LOW (ref 3.8–4.8)
Alkaline Phosphatase: 97 IU/L (ref 39–117)
BUN/Creatinine Ratio: 15 (ref 12–28)
BUN: 13 mg/dL (ref 8–27)
Bilirubin Total: 0.5 mg/dL (ref 0.0–1.2)
CO2: 23 mmol/L (ref 20–29)
Calcium: 9.3 mg/dL (ref 8.7–10.3)
Chloride: 107 mmol/L — ABNORMAL HIGH (ref 96–106)
Creatinine, Ser: 0.86 mg/dL (ref 0.57–1.00)
GFR calc Af Amer: 80 mL/min/{1.73_m2} (ref >59)
GFR calc non Af Amer: 69 mL/min/{1.73_m2} (ref >59)
Globulin, Total: 2.6 g/dL (ref 1.5–4.5)
Glucose: 109 mg/dL — ABNORMAL HIGH (ref 65–99)
Potassium: 4.3 mmol/L (ref 3.5–5.2)
Sodium: 142 mmol/L (ref 134–144)
Total Protein: 6.3 g/dL (ref 6.0–8.5)

## 2020-02-23 LAB — LIPID PANEL
Chol/HDL Ratio: 2.8 ratio (ref 0.0–4.4)
Cholesterol, Total: 142 mg/dL (ref 100–199)
HDL: 51 mg/dL (ref >39)
LDL Chol Calc (NIH): 71 mg/dL (ref 0–99)
Triglycerides: 107 mg/dL (ref 0–149)
VLDL Cholesterol Cal: 20 mg/dL (ref 5–40)

## 2020-03-01 ENCOUNTER — Other Ambulatory Visit: Payer: Self-pay | Admitting: Family Medicine

## 2020-03-01 DIAGNOSIS — E785 Hyperlipidemia, unspecified: Secondary | ICD-10-CM

## 2020-03-01 MED ORDER — ATORVASTATIN CALCIUM 20 MG PO TABS: 20.0000 mg | ORAL_TABLET | Freq: Every day | ORAL | 3 refills | Status: DC

## 2020-03-05 ENCOUNTER — Ambulatory Visit (INDEPENDENT_AMBULATORY_CARE_PROVIDER_SITE_OTHER): Payer: Medicare Other

## 2020-03-05 DIAGNOSIS — Z0000 Encounter for general adult medical examination without abnormal findings: Secondary | ICD-10-CM | POA: Diagnosis not present

## 2020-03-05 NOTE — Patient Instructions (Signed)
  [place] Maintenance Summary and Written Plan of Care  Ms. Joyce Jenkins ,  Thank you for allowing me to perform your Medicare Annual Wellness Visit and for your ongoing commitment to your health.   Health Maintenance & Immunization History Health Maintenance  Topic Date Due  . COVID-19 Vaccine (1) Never done  . DEXA SCAN  Never done  . MAMMOGRAM  12/25/2021  . COLONOSCOPY  02/17/2023  . TETANUS/TDAP  04/05/2023  . Hepatitis C Screening  Completed  . PNA vac Low Risk Adult  Completed  . INFLUENZA VACCINE  Discontinued   Immunization History  Administered Date(s) Administered  . Pneumococcal Conjugate-13 09/24/2016  . Pneumococcal Polysaccharide-23 07/15/2015  . Tdap 04/04/2013    These are the patient goals that we discussed:  Exercise 150 minutes per week  Schedule your annual eye exam   This is a list of Health Maintenance Items that are overdue or due now: Health Maintenance Due  Topic Date Due  . COVID-19 Vaccine (1) Never done  . DEXA SCAN  Never done     Orders/Referrals Placed Today: No orders of the defined types were placed in this encounter.  (Contact our referral department at [phone] if you have not spoken with someone about your referral appointment within the next 5 days)    Follow-up Plan  Scheduled with Dr. Lajuana Ripple 04/10/2020 at 11:00am

## 2020-03-05 NOTE — Progress Notes (Addendum)
MEDICARE ANNUAL WELLNESS VISIT  03/05/2020  Telephone Visit Disclaimer This Medicare AWV was conducted by telephone due to national recommendations for restrictions regarding the COVID-19 Pandemic (e.g. social distancing).  I verified, using two identifiers, that I am speaking with Joyce Jenkins or their authorized healthcare agent. I discussed the limitations, risks, security, and privacy concerns of performing an evaluation and management service by telephone and the potential availability of an in-person appointment in the future. The patient expressed understanding and agreed to proceed.   Subjective:  Joyce Jenkins is a 69 y.o. female patient of Janora Norlander, DO who had a Medicare Annual Wellness Visit today via telephone. Joyce Jenkins is Retired and lives with their spouse. she has five children. she reports that she is socially active and does interact with friends/family regularly. she is minimally physically active and enjoys reading.  Patient Care Team: Janora Norlander, DO as PCP - General (Family Medicine)  Advanced Directives 03/05/2020 02/16/2018  Does Patient Have a Medical Advance Directive? No No  Would patient like information on creating a medical advance directive? No - Patient declined No - Patient declined    Hospital Utilization Over the Past 12 Months: # of hospitalizations or ER visits: 0 # of surgeries: 0  Review of Systems    Patient reports that her overall health is unchanged compared to last year.  Patient Reported Readings (BP, Pulse, CBG, Weight, etc) none  Pain Assessment Pain : No/denies pain     Current Medications & Allergies (verified) Allergies as of 03/05/2020       Reactions   Salac [salicylic Acid] Swelling   Aspirin Swelling   Ibuprofen Other (See Comments)   Pt. Was told not to take Ibuprofen, based on her anaphylactic reaction to ASA        Medication List        Accurate as of Mar 05, 2020 10:12 AM. If you have any  questions, ask your nurse or doctor.          STOP taking these medications    fenoprofen 600 MG Tabs tablet Commonly known as: NALFON   predniSONE 10 MG (21) Tbpk tablet Commonly known as: STERAPRED UNI-PAK 21 TAB   Vitamin D3 250 MCG (10000 UT) Tabs       TAKE these medications    atorvastatin 20 MG tablet Commonly known as: LIPITOR Take 1 tablet (20 mg total) by mouth daily.   calcium carbonate 1500 (600 Ca) MG Tabs tablet Commonly known as: OSCAL Take 600 mg by mouth daily.   cetirizine 10 MG tablet Commonly known as: ZYRTEC Take 10 mg by mouth as needed.   Co Q-10 200 MG Caps Take 1 capsule by mouth daily.   famotidine 20 MG tablet Commonly known as: PEPCID Take by mouth.   multivitamin tablet Take 1 tablet by mouth daily.   pravastatin 40 MG tablet Commonly known as: PRAVACHOL Take 1 tablet (40 mg total) by mouth daily.        History (reviewed): Past Medical History:  Diagnosis Date   Allergy    Colon polyps    Diverticulosis    GERD (gastroesophageal reflux disease)    Hyperlipidemia    Obesity    Past Surgical History:  Procedure Laterality Date   CHOLECYSTECTOMY     THYROID CYST EXCISION     Family History  Problem Relation Age of Onset   Colon polyps Mother    Atrial fibrillation Mother    Diabetes Paternal  Grandmother    Cancer Father        lung and kidney   Heart disease Father    Colon cancer Neg Hx    Esophageal cancer Neg Hx    Liver cancer Neg Hx    Pancreatic cancer Neg Hx    Rectal cancer Neg Hx    Stomach cancer Neg Hx    Social History   Socioeconomic History   Marital status: Married    Spouse name: Not on file   Number of children: 2   Years of education: Not on file   Highest education level: Not on file  Occupational History   Occupation: Retired Pharmacist, hospital  Tobacco Use   Smoking status: Never Smoker   Smokeless tobacco: Never Used  Substance and Sexual Activity   Alcohol use: Yes    Alcohol/week:  2.0 standard drinks    Types: 2 Glasses of wine per week   Drug use: No   Sexual activity: Not on file  Other Topics Concern   Not on file  Social History Narrative   Not on file   Social Determinants of Health   Financial Resource Strain:    Difficulty of Paying Living Expenses:   Food Insecurity:    Worried About Charity fundraiser in the Last Year:    Arboriculturist in the Last Year:   Transportation Needs:    Film/video editor (Medical):    Lack of Transportation (Non-Medical):   Physical Activity:    Days of Exercise per Week:    Minutes of Exercise per Session:   Stress:    Feeling of Stress :   Social Connections:    Frequency of Communication with Friends and Family:    Frequency of Social Gatherings with Friends and Family:    Attends Religious Services:    Active Member of Clubs or Organizations:    Attends Archivist Meetings:    Marital Status:     Activities of Daily Living In your present state of health, do you have any difficulty performing the following activities: 03/05/2020  Hearing? N  Vision? N  Difficulty concentrating or making decisions? N  Walking or climbing stairs? N  Dressing or bathing? N  Doing errands, shopping? N  Preparing Food and eating ? N  Using the Toilet? N  In the past six months, have you accidently leaked urine? Y  Comment Patient wears a pad daily. Does not want a referral. Will manage on her own.  Do you have problems with loss of bowel control? N  Managing your Medications? N  Managing your Finances? N  Housekeeping or managing your Housekeeping? N  Some recent data might be hidden    Patient Education/ Literacy How often do you need to have someone help you when you read instructions, pamphlets, or other written materials from your doctor or pharmacy?: 1 - Never What is the last grade level you completed in school?: College  Exercise Current Exercise Habits: Home exercise routine, Type of exercise:  walking, Time (Minutes): 30, Frequency (Times/Week): 5, Weekly Exercise (Minutes/Week): 150, Intensity: Mild, Exercise limited by: None identified  Diet Patient reports consuming 3 meals a day and 0 snack(s) a day Patient reports that her primary diet is: Regular Patient reports that she does have regular access to food.   Depression Screen PHQ 2/9 Scores 03/05/2020 12/26/2019 10/30/2019 04/25/2019  PHQ - 2 Score 0 0 0 0  PHQ- 9 Score - - 0 -  Fall Risk Fall Risk  03/05/2020 12/26/2019 10/30/2019  Falls in the past year? 0 0 0     Objective:  Joyce Jenkins seemed alert and oriented and she participated appropriately during our telephone visit.  Blood Pressure Weight BMI  BP Readings from Last 3 Encounters:  12/26/19 132/70  10/30/19 130/85  04/25/19 139/82   Wt Readings from Last 3 Encounters:  12/26/19 216 lb 9.6 oz (98.2 kg)  10/30/19 223 lb (101.2 kg)  04/25/19 224 lb 6.4 oz (101.8 kg)   BMI Readings from Last 1 Encounters:  12/26/19 37.18 kg/m    *Unable to obtain current vital signs, weight, and BMI due to telephone visit type  Hearing/Vision  Joyce Jenkins did not seem to have difficulty with hearing/understanding during the telephone conversation Reports that she has not had a formal eye exam by an eye care professional within the past year Reports that she has not had a formal hearing evaluation within the past year *Unable to fully assess hearing and vision during telephone visit type  Cognitive Function: 6CIT Screen 03/05/2020  What Year? 0 points  What month? 0 points  What time? 0 points  Count back from 20 0 points  Months in reverse 0 points  Repeat phrase 0 points  Total Score 0   (Normal:0-7, Significant for Dysfunction: >8)  Normal Cognitive Function Screening: Yes   Immunization & Health Maintenance Record Immunization History  Administered Date(s) Administered   Pneumococcal Conjugate-13 09/24/2016   Pneumococcal Polysaccharide-23 07/15/2015   Tdap  04/04/2013    Health Maintenance  Topic Date Due   COVID-19 Vaccine (1) Never done   DEXA SCAN  Never done   MAMMOGRAM  12/25/2021   COLONOSCOPY  02/17/2023   TETANUS/TDAP  04/05/2023   Hepatitis C Screening  Completed   PNA vac Low Risk Adult  Completed   INFLUENZA VACCINE  Discontinued       Assessment  This is a routine wellness examination for Joyce Jenkins.  Health Maintenance: Due or Overdue Health Maintenance Due  Topic Date Due   COVID-19 Vaccine (1) Never done   DEXA SCAN  Never done    Joyce Jenkins does not need a referral for Community Assistance: Care Management:   no Social Work:    no Prescription Assistance:  no Nutrition/Diabetes Education:  no   Plan:  Personalized Goals Goals Addressed             This Visit's Progress    Exercise 150 min/wk Moderate Activity       Obtain Annual Eye (retinal)  Exam          Personalized Health Maintenance & Screening Recommendations   Lung Cancer Screening Recommended: no (Low Dose CT Chest recommended if Age 59-80 years, 30 pack-year currently smoking OR have quit w/in past 15 years) Hepatitis C Screening recommended: no HIV Screening recommended: no  Advanced Directives: Written information was not prepared per patient's request.  Referrals & Orders No orders of the defined types were placed in this encounter.   Follow-up Plan Follow-up with Janora Norlander, DO as planned Schedule 04/10/2020   I have personally reviewed and noted the following in the patient's chart:   Medical and social history Use of alcohol, tobacco or illicit drugs  Current medications and supplements Functional ability and status Nutritional status Physical activity Advanced directives List of other physicians Hospitalizations, surgeries, and ER visits in previous 12 months Vitals Screenings to include cognitive, depression, and falls Referrals and appointments  In addition,  I have reviewed and discussed with  Joyce Jenkins certain preventive protocols, quality metrics, and best practice recommendations. A written personalized care plan for preventive services as well as general preventive health recommendations is available and can be mailed to the patient at her request.      Maud Deed [facility]  [id number]    I have reviewed and agree with the above AWV documentation.    Evelina Dun, FNP

## 2020-04-10 ENCOUNTER — Other Ambulatory Visit: Payer: Self-pay

## 2020-04-10 ENCOUNTER — Ambulatory Visit (INDEPENDENT_AMBULATORY_CARE_PROVIDER_SITE_OTHER): Payer: Medicare Other | Admitting: Family Medicine

## 2020-04-10 ENCOUNTER — Encounter: Payer: Self-pay | Admitting: Family Medicine

## 2020-04-10 VITALS — BP 138/72 | HR 72 | Temp 96.8°F | Ht 64.0 in | Wt 215.0 lb

## 2020-04-10 DIAGNOSIS — L821 Other seborrheic keratosis: Secondary | ICD-10-CM

## 2020-04-10 DIAGNOSIS — E785 Hyperlipidemia, unspecified: Secondary | ICD-10-CM

## 2020-04-10 NOTE — Patient Instructions (Signed)

## 2020-04-10 NOTE — Progress Notes (Signed)
Subjective: CC: HLD PCP: Janora Norlander, DO [id number] Joyce Jenkins is a 69 y.o. female presenting to clinic today for:  1. HLD Patient was transitioned to Atorvastatin 20mg  last visit.  She has that she has been doing well on this medication.  Denies any myalgia, chest pain, abdominal pain, jaundice.  2.  Skin lesions Patient notes a skin lesion along the left side of her knee and right lateral thigh.  No substantial change in size, color.  No spontaneous bleeding.  She just wanted to have these checked out.   ROS: Per HPI  Allergies  Allergen Reactions  . Salac [Salicylic Acid] Swelling  . Aspirin Swelling  . Ibuprofen Other (See Comments)    Pt. Was told not to take Ibuprofen, based on her anaphylactic reaction to ASA   Past Medical History:  Diagnosis Date  . Allergy   . Colon polyps   . Diverticulosis   . GERD (gastroesophageal reflux disease)   . Hyperlipidemia   . Obesity     Current Outpatient Medications:  .  atorvastatin (LIPITOR) 20 MG tablet, Take 1 tablet (20 mg total) by mouth daily. (Patient not taking: Reported on 03/05/2020), Disp: 90 tablet, Rfl: 3 .  calcium carbonate (OSCAL) 1500 (600 Ca) MG TABS tablet, Take 600 mg by mouth daily., Disp: , Rfl:  .  cetirizine (ZYRTEC) 10 MG tablet, Take 10 mg by mouth as needed. , Disp: , Rfl:  .  Coenzyme Q10 (CO Q-10) 200 MG CAPS, Take 1 capsule by mouth daily., Disp: , Rfl:  .  famotidine (PEPCID) 20 MG tablet, Take by mouth., Disp: , Rfl:  .  Multiple Vitamin (MULTIVITAMIN) tablet, Take 1 tablet by mouth daily., Disp: , Rfl:  .  pravastatin (PRAVACHOL) 40 MG tablet, Take 1 tablet (40 mg total) by mouth daily., Disp: 90 tablet, Rfl: 1 Social History   Socioeconomic History  . Marital status: Married    Spouse name: Not on file  . Number of children: 2  . Years of education: Not on file  . Highest education level: Not on file  Occupational History  . Occupation: Retired Pharmacist, hospital  Tobacco Use  . Smoking status:  Never Smoker  . Smokeless tobacco: Never Used  Vaping Use  . Vaping Use: Never used  Substance and Sexual Activity  . Alcohol use: Yes    Alcohol/week: 2.0 standard drinks    Types: 2 Glasses of wine per week  . Drug use: No  . Sexual activity: Not on file  Other Topics Concern  . Not on file  Social History Narrative  . Not on file   Social Determinants of Health   Financial Resource Strain:   . Difficulty of Paying Living Expenses:   Food Insecurity:   . Worried About Charity fundraiser in the Last Year:   . Arboriculturist in the Last Year:   Transportation Needs:   . Film/video editor (Medical):   Marland Kitchen Lack of Transportation (Non-Medical):   Physical Activity:   . Days of Exercise per Week:   . Minutes of Exercise per Session:   Stress:   . Feeling of Stress :   Social Connections:   . Frequency of Communication with Friends and Family:   . Frequency of Social Gatherings with Friends and Family:   . Attends Religious Services:   . Active Member of Clubs or Organizations:   . Attends Archivist Meetings:   Marland Kitchen Marital Status:   Intimate Production manager  Violence:   . Fear of Current or Ex-Partner:   . Emotionally Abused:   Marland Kitchen Physically Abused:   . Sexually Abused:    Family History  Problem Relation Age of Onset  . Colon polyps Mother   . Atrial fibrillation Mother   . Diabetes Paternal Grandmother   . Cancer Father        lung and kidney  . Heart disease Father   . Colon cancer Neg Hx   . Esophageal cancer Neg Hx   . Liver cancer Neg Hx   . Pancreatic cancer Neg Hx   . Rectal cancer Neg Hx   . Stomach cancer Neg Hx     Objective: Office vital signs reviewed. BP 138/72   Pulse 72   Temp (!) 96.8 F (36 C) (Temporal)   Ht 5\' 4"  (1.626 m)   Wt 215 lb (97.5 kg)   SpO2 99%   BMI 36.90 kg/m   Physical Examination:  General: Awake, alert, well nourished, No acute distress HEENT: Normal, sclera white, MMM Cardio: regular rate and rhythm, S1S2  heard, no murmurs appreciated Pulm: clear to auscultation bilaterally, no wheezes, rhonchi or rales; normal work of breathing on room air Extremities: warm, well perfused, No edema, cyanosis or clubbing; +2 pulses bilaterally Skin : Right upper lateral thigh with seborrheic keratosis.  She has a very similar lesion on the left inner knee but the left inner knee lesion does have some slight irregularity.  This has been stable for many years   Assessment/ Plan: 69 y.o. female   1. Dyslipidemia Check fasting lipid panel and LFTs.  If improved/stable okay to follow-up in 1 year - Hepatic Function Panel - Lipid Panel  2. Seborrheic keratoses We will watch the lesion on the left inner knee just because it has somewhat of an irregular shape.  However, clinically seems consistent with seborrheic keratoses   No orders of the defined types were placed in this encounter.  No orders of the defined types were placed in this encounter.    Janora Norlander, DO [place] [phone]

## 2020-04-11 LAB — HEPATIC FUNCTION PANEL
ALT: 24 IU/L (ref 0–32)
AST: 21 IU/L (ref 0–40)
Albumin: 4.1 g/dL (ref 3.8–4.8)
Alkaline Phosphatase: 111 IU/L (ref 48–121)
Bilirubin Total: 0.5 mg/dL (ref 0.0–1.2)
Bilirubin, Direct: 0.2 mg/dL (ref 0.00–0.40)
Total Protein: 6.5 g/dL (ref 6.0–8.5)

## 2020-04-11 LAB — LIPID PANEL
Chol/HDL Ratio: 3 ratio (ref 0.0–4.4)
Cholesterol, Total: 146 mg/dL (ref 100–199)
HDL: 49 mg/dL (ref >39)
LDL Chol Calc (NIH): 71 mg/dL (ref 0–99)
Triglycerides: 151 mg/dL — ABNORMAL HIGH (ref 0–149)
VLDL Cholesterol Cal: 26 mg/dL (ref 5–40)

## 2020-04-12 ENCOUNTER — Other Ambulatory Visit: Payer: Self-pay | Admitting: Family Medicine

## 2020-04-12 DIAGNOSIS — E785 Hyperlipidemia, unspecified: Secondary | ICD-10-CM

## 2020-04-12 MED ORDER — ATORVASTATIN CALCIUM 20 MG PO TABS: 40.0000 mg | ORAL_TABLET | Freq: Every day | ORAL | 3 refills | Status: DC

## 2020-04-21 ENCOUNTER — Encounter: Payer: Self-pay | Admitting: Family Medicine

## 2020-04-21 DIAGNOSIS — E785 Hyperlipidemia, unspecified: Secondary | ICD-10-CM

## 2020-04-22 MED ORDER — ATORVASTATIN CALCIUM 20 MG PO TABS: 40.0000 mg | ORAL_TABLET | Freq: Every day | ORAL | 3 refills | Status: DC

## 2020-09-26 ENCOUNTER — Encounter: Payer: Self-pay | Admitting: Family Medicine

## 2020-09-27 ENCOUNTER — Encounter: Payer: Self-pay | Admitting: Family Medicine

## 2020-09-27 ENCOUNTER — Telehealth (INDEPENDENT_AMBULATORY_CARE_PROVIDER_SITE_OTHER): Payer: Medicare Other | Admitting: Family Medicine

## 2020-09-27 DIAGNOSIS — M5431 Sciatica, right side: Secondary | ICD-10-CM

## 2020-09-27 MED ORDER — PREDNISONE 10 MG PO TABS: ORAL_TABLET | ORAL | 0 refills | Status: DC

## 2020-09-27 NOTE — Progress Notes (Signed)
Subjective:    Patient ID: Joyce Jenkins, female    DOB: [birth year], 69 y.o.   MRN: [id number]   HPI: Joyce Jenkins is a 69 y.o. female presenting for right sciatic pain 11/10 at onset 4 days ago it has now received about 5-6/2:10 days of excruciating pain.  She says all she did when she stood up and turned at the same time and that is when the pain hit her suddenly.  She had sciatic most recently 3 years ago.  She did have some muscle relaxers on hand so she started taking those along with applying ice alternating with heat.  She does have some low back exercises she does regularly as well.  She says that she is sleeping well in the spite of the discomfort.  However, standing up is quite painful still.  No fever no upper respiratory symptoms no dysuria.   Depression screen [facility] 2/9 04/10/2020 03/05/2020 12/26/2019 10/30/2019 04/25/2019  Decreased Interest 0 0 0 0 0  Down, Depressed, Hopeless 0 0 0 0 0  PHQ - 2 Score 0 0 0 0 0  Altered sleeping 0 - - 0 -  Tired, decreased energy 0 - - 0 -  Change in appetite 0 - - 0 -  Feeling bad or failure about yourself  0 - - 0 -  Trouble concentrating 0 - - 0 -  Moving slowly or fidgety/restless 0 - - 0 -  Suicidal thoughts 0 - - 0 -  PHQ-9 Score 0 - - 0 -     Relevant past medical, surgical, family and social history reviewed and updated as indicated.  Interim medical history since our last visit reviewed. Allergies and medications reviewed and updated.  ROS:  Review of Systems  Noncontributory except as found in HPI, Social History   Tobacco Use  Smoking Status Never Smoker  Smokeless Tobacco Never Used       Objective:     Wt Readings from Last 3 Encounters:  04/10/20 215 lb (97.5 kg)  12/26/19 216 lb 9.6 oz (98.2 kg)  10/30/19 223 lb (101.2 kg)     Exam deferred. Pt. Harboring due to COVID 19. Phone visit performed.   Assessment & Plan:   1. Sciatica of right side     Meds ordered this encounter  Medications  . predniSONE  (DELTASONE) 10 MG tablet    Sig: Take 5 daily for 3 days followed by 4,3,2 and 1 for 3 days each.    Dispense:  45 tablet    Refill:  0    No orders of the defined types were placed in this encounter.     Diagnoses and all orders for this visit:  Sciatica of right side  Other orders -     predniSONE (DELTASONE) 10 MG tablet; Take 5 daily for 3 days followed by 4,3,2 and 1 for 3 days each.    Virtual Visit via telephone Note  I discussed the limitations, risks, security and privacy concerns of performing an evaluation and management service by telephone and the availability of in person appointments. The patient was identified with two identifiers. Pt.expressed understanding and agreed to proceed. Pt. Is at home. Dr. Livia Snellen is in his office.  Follow Up Instructions:   I discussed the assessment and treatment plan with the patient. The patient was provided an opportunity to ask questions and all were answered. The patient agreed with the plan and demonstrated an understanding of the instructions.   The patient was  advised to call back or seek an in-person evaluation if the symptoms worsen or if the condition fails to improve as anticipated.   Total minutes including chart review and phone contact time: 16   Follow up plan: Return if symptoms worsen or fail to improve.  Joyce Fraise, MD [place]

## 2020-10-15 ENCOUNTER — Encounter: Payer: Self-pay | Admitting: Family Medicine

## 2020-10-15 ENCOUNTER — Telehealth: Payer: Medicare Other | Admitting: Emergency Medicine

## 2020-10-15 DIAGNOSIS — J019 Acute sinusitis, unspecified: Secondary | ICD-10-CM | POA: Diagnosis not present

## 2020-10-15 MED ORDER — AZITHROMYCIN 250 MG PO TABS: ORAL_TABLET | ORAL | 0 refills | Status: DC

## 2020-10-15 NOTE — Progress Notes (Signed)
We are sorry that you are not feeling well.  Here is how we plan to help!  Based on what you have shared with me it looks like you have sinusitis.  Sinusitis is inflammation and infection in the sinus cavities of the head.  Based on your presentation I believe you most likely have Acute Bacterial Sinusitis.  This is an infection caused by bacteria and is treated with antibiotics. I have prescribed a Zpack used as directed. You may use an oral decongestant such as Mucinex D or if you have glaucoma or high blood pressure use plain Mucinex. Saline nasal spray help and can safely be used as often as needed for congestion.  If you develop worsening sinus pain, fever or notice severe headache and vision changes, or if symptoms are not better after completion of antibiotic, please schedule an appointment with a health care provider.    Sinus infections are not as easily transmitted as other respiratory infection, however we still recommend that you avoid close contact with loved ones, especially the very young and elderly.  Remember to wash your hands thoroughly throughout the day as this is the number one way to prevent the spread of infection!  Home Care:  Only take medications as instructed by your medical team.  Complete the entire course of an antibiotic.  Do not take these medications with alcohol.  A steam or ultrasonic humidifier can help congestion.  You can place a towel over your head and breathe in the steam from hot water coming from a faucet.  Avoid close contacts especially the very young and the elderly.  Cover your mouth when you cough or sneeze.  Always remember to wash your hands.  Get Help Right Away If:  You develop worsening fever or sinus pain.  You develop a severe head ache or visual changes.  Your symptoms persist after you have completed your treatment plan.  Make sure you  Understand these instructions.  Will watch your condition.  Will get help right away if  you are not doing well or get worse.  Your e-visit answers were reviewed by a board certified advanced clinical practitioner to complete your personal care plan.  Depending on the condition, your plan could have included both over the counter or prescription medications.  If there is a problem please reply  once you have received a response from your provider.  Your safety is important to [place].  If you have drug allergies check your prescription carefully.    You can use MyChart to ask questions about today's visit, request a non-urgent call back, or ask for a work or school excuse for 24 hours related to this e-Visit. If it has been greater than 24 hours you will need to follow up with your provider, or enter a new e-Visit to address those concerns.  You will get an e-mail in the next two days asking about your experience.  I hope that your e-visit has been valuable and will speed your recovery. Thank you for using e-visits.   **Please do not respond to this message unless you have follow up questions.** Greater than 5 but less than 10 minutes spent researching, coordinating, and implementing care for this patient today

## 2021-01-07 ENCOUNTER — Encounter: Payer: Self-pay | Admitting: Family Medicine

## 2021-01-07 ENCOUNTER — Telehealth (INDEPENDENT_AMBULATORY_CARE_PROVIDER_SITE_OTHER): Payer: Medicare Other | Admitting: Family Medicine

## 2021-01-07 DIAGNOSIS — Z91038 Other insect allergy status: Secondary | ICD-10-CM

## 2021-01-07 MED ORDER — DOXYCYCLINE HYCLATE 100 MG PO TABS: 100.0000 mg | ORAL_TABLET | Freq: Two times a day (BID) | ORAL | 0 refills | Status: DC

## 2021-01-07 MED ORDER — TRIAMCINOLONE ACETONIDE 0.1 % EX CREA: 1.0000 "application " | TOPICAL_CREAM | Freq: Two times a day (BID) | CUTANEOUS | 0 refills | Status: DC

## 2021-01-07 NOTE — Patient Instructions (Signed)

## 2021-01-07 NOTE — Progress Notes (Signed)
MyChart Video visit  Subjective: CC: rash PCP: Janora Norlander, DO JOI:NOMVEH Joyce Jenkins is a 70 y.o. female. Patient provides verbal consent for consult held via video.  Due to COVID-19 pandemic this visit was conducted virtually. This visit type was conducted due to national recommendations for restrictions regarding the COVID-19 Pandemic (e.g. social distancing, sheltering in place) in an effort to limit this patient's exposure and mitigate transmission in our community. All issues noted in this document were discussed and addressed.  A physical exam was not performed with this format.   Location of patient: home Location of provider: [facility] Others present for call: spouse  1. Rash She reports feeling a rash on her back a couple days ago that is itchy but getting bigger.  Does not recall being bitten by anything.  No known mosquito, tick or flea exposures.  No rash elsewhere.  No pain at the site of the rash.  No drainage.   ROS: Per HPI  Allergies  Allergen Reactions  . Salac [Salicylic Acid] Swelling  . Aspirin Swelling  . Ibuprofen Other (See Comments)    Pt. Was told not to take Ibuprofen, based on her anaphylactic reaction to ASA   Past Medical History:  Diagnosis Date  . Allergy   . Colon polyps   . Diverticulosis   . GERD (gastroesophageal reflux disease)   . Hyperlipidemia   . Obesity     Current Outpatient Medications:  .  atorvastatin (LIPITOR) 20 MG tablet, Take 2 tablets (40 mg total) by mouth daily., Disp: 180 tablet, Rfl: 3 .  azithromycin (ZITHROMAX) 250 MG tablet, Take 2 tablets on the first day then one tablet daily., Disp: 6 tablet, Rfl: 0 .  calcium carbonate (OSCAL) 1500 (600 Ca) MG TABS tablet, Take 600 mg by mouth daily., Disp: , Rfl:  .  cetirizine (ZYRTEC) 10 MG tablet, Take 10 mg by mouth as needed. , Disp: , Rfl:  .  Coenzyme Q10 (CO Q-10) 200 MG CAPS, Take 1 capsule by mouth daily., Disp: , Rfl:  .  famotidine (PEPCID) 20 MG tablet, Take by  mouth., Disp: , Rfl:  .  Multiple Vitamin (MULTIVITAMIN) tablet, Take 1 tablet by mouth daily., Disp: , Rfl:  .  predniSONE (DELTASONE) 10 MG tablet, Take 5 daily for 3 days followed by 4,3,2 and 1 for 3 days each., Disp: 45 tablet, Rfl: 0  Skin: Very large area of erythema noted along the left flank.  There is an area of hyperpigmentation and central induration.  No active oozing.  There is localized inflammation.  Assessment/ Plan: 70 y.o. 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  She will start the antibiotic tomorrow pending response to topical corticosteroid and oral antihistamine.  She frequently has secondary bacterial infections when she gets insect bites.  This certainly does not appear to be consistent with shingles.  We discussed home care instructions and reasons for reevaluation.  She was good understanding follow-up as needed  Start time: 12:34pm End time: 12:41pm  Total time spent on patient care (including video visit/ documentation): 7 minutes  [place], [place] [phone]

## 2021-01-27 ENCOUNTER — Other Ambulatory Visit: Payer: Self-pay | Admitting: Family Medicine

## 2021-01-27 DIAGNOSIS — Z1231 Encounter for screening mammogram for malignant neoplasm of breast: Secondary | ICD-10-CM

## 2021-03-18 ENCOUNTER — Other Ambulatory Visit: Payer: Self-pay

## 2021-03-18 ENCOUNTER — Ambulatory Visit
Admission: RE | Admit: 2021-03-18 | Discharge: 2021-03-18 | Disposition: A | Discharge status: Home / Self Care | Payer: Medicare Other | Source: Ambulatory Visit | Attending: Family Medicine | Admitting: Family Medicine

## 2021-03-18 DIAGNOSIS — Z1231 Encounter for screening mammogram for malignant neoplasm of breast: Secondary | ICD-10-CM

## 2021-03-19 ENCOUNTER — Other Ambulatory Visit: Payer: Self-pay | Admitting: Family Medicine

## 2021-03-19 DIAGNOSIS — N644 Mastodynia: Secondary | ICD-10-CM

## 2021-03-28 ENCOUNTER — Other Ambulatory Visit: Payer: Self-pay

## 2021-03-28 ENCOUNTER — Ambulatory Visit
Admission: RE | Admit: 2021-03-28 | Discharge: 2021-03-28 | Disposition: A | Discharge status: Home / Self Care | Payer: Medicare Other | Source: Ambulatory Visit | Attending: Family Medicine | Admitting: Family Medicine

## 2021-03-28 ENCOUNTER — Ambulatory Visit: Payer: Medicare Other

## 2021-03-28 DIAGNOSIS — N644 Mastodynia: Secondary | ICD-10-CM

## 2021-04-14 ENCOUNTER — Ambulatory Visit (INDEPENDENT_AMBULATORY_CARE_PROVIDER_SITE_OTHER): Payer: Medicare Other

## 2021-04-14 DIAGNOSIS — Z0000 Encounter for general adult medical examination without abnormal findings: Secondary | ICD-10-CM

## 2021-04-14 NOTE — Progress Notes (Signed)
MEDICARE ANNUAL WELLNESS VISIT  04/14/2021  Telephone Visit Disclaimer This Medicare AWV was conducted by telephone due to national recommendations for restrictions regarding the COVID-19 Pandemic (e.g. social distancing).  I verified, using two identifiers, that I am speaking with Sharol Harness or their authorized healthcare agent. I discussed the limitations, risks, security, and privacy concerns of performing an evaluation and management service by telephone and the potential availability of an in-person appointment in the future. The patient expressed understanding and agreed to proceed.  Location of Patient:  Home Location of Provider (nurse):  [facility]  Subjective:    RAYLI WIEDERHOLD is a 70 y.o. female patient of Janora Norlander, DO who had a Medicare Annual Wellness Visit today via telephone. Briseida is Retired and lives with their spouse. she has two children.and three grandchildren.  She reports that is socially active and does interact with friends/family regularly. She is minimally physically active and enjoys reading and playing the piano.  Patient Care Team: Janora Norlander, DO as PCP - General (Family Medicine)  Advanced Directives 04/14/2021 03/05/2020 02/16/2018  Does Patient Have a Medical Advance Directive? No No No  Would patient like information on creating a medical advance directive? No - Patient declined No - Patient declined No - Patient declined    Hospital Utilization Over the Past 12 Months: # of hospitalizations or ER visits: 0 # of surgeries: 0  Review of Systems    Patient reports that her overall health is unchanged compared to last year.  History obtained from chart review and the patient  Patient Reported Readings (BP, Pulse, CBG, Weight, etc) none  Pain Assessment Pain : No/denies pain     Current Medications & Allergies (verified) Allergies as of 04/14/2021       Reactions   Salac [salicylic Acid] Swelling   Aspirin Swelling    Ibuprofen Other (See Comments)   Pt. Was told not to take Ibuprofen, based on her anaphylactic reaction to ASA        Medication List        Accurate as of April 14, 2021 11:24 AM. If you have any questions, ask your nurse or doctor.          STOP taking these medications    doxycycline 100 MG tablet Commonly known as: VIBRA-TABS   triamcinolone cream 0.1 % Commonly known as: KENALOG       TAKE these medications    atorvastatin 20 MG tablet Commonly known as: LIPITOR Take 2 tablets (40 mg total) by mouth daily.   calcium carbonate 1500 (600 Ca) MG Tabs tablet Commonly known as: OSCAL Take 600 mg by mouth daily.   cetirizine 10 MG tablet Commonly known as: ZYRTEC Take 10 mg by mouth as needed.   Co Q-10 200 MG Caps Take 1 capsule by mouth daily.   famotidine 20 MG tablet Commonly known as: PEPCID Take by mouth.   multivitamin tablet Take 1 tablet by mouth daily.        History (reviewed): Past Medical History:  Diagnosis Date   Allergy    Colon polyps    Diverticulosis    GERD (gastroesophageal reflux disease)    Hyperlipidemia    Obesity    Past Surgical History:  Procedure Laterality Date   CHOLECYSTECTOMY     THYROID CYST EXCISION     Family History  Problem Relation Age of Onset   Colon polyps Mother    Atrial fibrillation Mother  Diabetes Paternal Grandmother    Cancer Father        lung and kidney   Heart disease Father    Colon cancer Neg Hx    Esophageal cancer Neg Hx    Liver cancer Neg Hx    Pancreatic cancer Neg Hx    Rectal cancer Neg Hx    Stomach cancer Neg Hx    Social History   Socioeconomic History   Marital status: Married    Spouse name: Not on file   Number of children: 2   Years of education: Not on file   Highest education level: Not on file  Occupational History   Occupation: Retired Pharmacist, hospital  Tobacco Use   Smoking status: Never   Smokeless tobacco: Never  Vaping Use   Vaping Use: Never used   Substance and Sexual Activity   Alcohol use: Yes    Alcohol/week: 2.0 standard drinks    Types: 2 Glasses of wine per week   Drug use: No   Sexual activity: Not on file  Other Topics Concern   Not on file  Social History Narrative   Not on file   Social Determinants of Health   Financial Resource Strain: Not on file  Food Insecurity: Not on file  Transportation Needs: Not on file  Physical Activity: Not on file  Stress: Not on file  Social Connections: Not on file    Activities of Daily Living In your present state of health, do you have any difficulty performing the following activities: 04/14/2021  Hearing? N  Vision? N  Difficulty concentrating or making decisions? N  Walking or climbing stairs? Y  Comment right knee pain  Dressing or bathing? N  Doing errands, shopping? N  Preparing Food and eating ? N  Using the Toilet? N  In the past six months, have you accidently leaked urine? N  Do you have problems with loss of bowel control? N  Managing your Medications? N  Managing your Finances? N  Housekeeping or managing your Housekeeping? N  Some recent data might be hidden  Patient reports pain in her right knee when climbing stairs and walking.  She has recently seen her orthopedic doctor and had an injection in that knee which does seem to have helped.  Patient Education/ Literacy How often do you need to have someone help you when you read instructions, pamphlets, or other written materials from your doctor or pharmacy?: 1 - Never What is the last grade level you completed in school?: Bachelor's Degree  Exercise Current Exercise Habits: Structured exercise class, Type of exercise: Other - see comments (Silver Sneakers move program), Time (Minutes): 45, Frequency (Times/Week): 2, Weekly Exercise (Minutes/Week): 90, Intensity: Mild, Exercise limited by: None identified  Diet Patient reports consuming 2 meals a day and 1 snack(s) a day Patient reports that her  primary diet is: Regular Patient reports that she does have regular access to food.   Depression Screen PHQ 2/9 Scores 04/14/2021 04/10/2020 03/05/2020 12/26/2019 10/30/2019 04/25/2019  PHQ - 2 Score 0 0 0 0 0 0  PHQ- 9 Score - 0 - - 0 -     Fall Risk Fall Risk  04/14/2021 04/10/2020 03/05/2020 12/26/2019 10/30/2019  Falls in the past year? 0 0 0 0 0  Follow up Falls evaluation completed - - - -     Objective:  Sharol Harness seemed alert and oriented and she participated appropriately during our telephone visit.  Blood Pressure Weight BMI  BP Readings  from Last 3 Encounters:  04/10/20 138/72  12/26/19 132/70  10/30/19 130/85   Wt Readings from Last 3 Encounters:  04/10/20 215 lb (97.5 kg)  12/26/19 216 lb 9.6 oz (98.2 kg)  10/30/19 223 lb (101.2 kg)   BMI Readings from Last 1 Encounters:  04/10/20 36.90 kg/m    *Unable to obtain current vital signs, weight, and BMI due to telephone visit type  Hearing/Vision  Alvera did not seem to have difficulty with hearing/understanding during the telephone conversation Reports that she has had a formal eye exam by an eye care professional within the past year Reports that she has not had a formal hearing evaluation within the past year *Unable to fully assess hearing and vision during telephone visit type  Cognitive Function: 6CIT Screen 04/14/2021 03/05/2020  What Year? 0 points 0 points  What month? 0 points 0 points  What time? 0 points 0 points  Count back from 20 0 points 0 points  Months in reverse 0 points 0 points  Repeat phrase 0 points 0 points  Total Score 0 0   (Normal:0-7, Significant for Dysfunction: >8)  Normal Cognitive Function Screening: Yes   Immunization & Health Maintenance Record Immunization History  Administered Date(s) Administered   Pneumococcal Conjugate-13 09/24/2016   Pneumococcal Polysaccharide-23 07/15/2015   Tdap 04/04/2013    Health Maintenance  Topic Date Due   COVID-19 Vaccine (1) Never done    Zoster Vaccines- Shingrix (1 of 2) Never done   DEXA SCAN  Never done   COLONOSCOPY (Pts 45-23yrs Insurance coverage will need to be confirmed)  02/17/2023   MAMMOGRAM  03/29/2023   TETANUS/TDAP  04/05/2023   Hepatitis C Screening  Completed   PNA vac Low Risk Adult  Completed   HPV VACCINES  Aged Out   INFLUENZA VACCINE  Discontinued       Assessment  This is a routine wellness examination for [employer].  Health Maintenance: Due or Overdue Health Maintenance Due  Topic Date Due   COVID-19 Vaccine (1) Never done   Zoster Vaccines- Shingrix (1 of 2) Never done   DEXA SCAN  Never done    Sharol Harness does not need a referral for [employer] Assistance: Care Management:   no Social Work:    no Prescription Assistance:  no Nutrition/Diabetes Education:  no   Plan:  Personalized Goals  Goals Addressed             This Visit's Progress    Patient Stated       04/14/2021 AWV Goal: Fall Prevention  Over the next year, patient will decrease their risk for falls by: Using assistive devices, such as a cane or walker, as needed Identifying fall risks within their home and correcting them by: Removing throw rugs Adding handrails to stairs or ramps Removing clutter and keeping a clear pathway throughout the home Increasing light, especially at night Adding shower handles/bars Raising toilet seat Identifying potential personal risk factors for falls: Medication side effects Incontinence/urgency Vestibular dysfunction Hearing loss Musculoskeletal disorders Neurological disorders Orthostatic hypotension          Personalized Health Maintenance & Screening Recommendations  Bone densitometry screening  Lung Cancer Screening Recommended: no (Low Dose CT Chest recommended if Age 82-80 years, 30 pack-year currently smoking OR have quit w/in past 15 years) Hepatitis C Screening recommended: no HIV Screening recommended: no  Advanced Directives: Written  information was not prepared per patient's request.  Referrals & Orders No orders of the  defined types were placed in this encounter.   Follow-up Plan Follow-up with Janora Norlander, DO as planned Schedule Dexa scan    I have personally reviewed and noted the following in the patient's chart:   Medical and social history Use of alcohol, tobacco or illicit drugs  Current medications and supplements Functional ability and status Nutritional status Physical activity Advanced directives List of other physicians Hospitalizations, surgeries, and ER visits in previous 12 months Vitals Screenings to include cognitive, depression, and falls Referrals and appointments  In addition, I have reviewed and discussed with Sharol Harness certain preventive protocols, quality metrics, and best practice recommendations. A written personalized care plan for preventive services as well as general preventive health recommendations is available and can be mailed to the patient at her request.      Felicity Coyer, LPN    07/15/1659    Patient declined after visit summary.

## 2021-05-23 ENCOUNTER — Other Ambulatory Visit: Payer: Self-pay | Admitting: Family Medicine

## 2021-05-23 DIAGNOSIS — E785 Hyperlipidemia, unspecified: Secondary | ICD-10-CM

## 2021-05-31 ENCOUNTER — Other Ambulatory Visit: Payer: Self-pay | Admitting: Family Medicine

## 2021-05-31 DIAGNOSIS — E785 Hyperlipidemia, unspecified: Secondary | ICD-10-CM

## 2021-06-18 ENCOUNTER — Ambulatory Visit (INDEPENDENT_AMBULATORY_CARE_PROVIDER_SITE_OTHER): Payer: Medicare Other | Admitting: Family Medicine

## 2021-06-18 ENCOUNTER — Encounter: Payer: Self-pay | Admitting: Family Medicine

## 2021-06-18 ENCOUNTER — Other Ambulatory Visit: Payer: Self-pay

## 2021-06-18 VITALS — BP 126/80 | HR 85 | Temp 97.5°F | Ht 64.0 in | Wt 217.8 lb

## 2021-06-18 DIAGNOSIS — K5900 Constipation, unspecified: Secondary | ICD-10-CM

## 2021-06-18 DIAGNOSIS — E785 Hyperlipidemia, unspecified: Secondary | ICD-10-CM

## 2021-06-18 MED ORDER — ATORVASTATIN CALCIUM 40 MG PO TABS: 40.0000 mg | ORAL_TABLET | Freq: Every day | ORAL | 3 refills | Status: DC

## 2021-06-18 NOTE — Progress Notes (Signed)
Subjective: CC: Follow-up hyperlipidemia PCP: Janora Norlander, DO IZT:IWPYKD Joyce Jenkins is a 70 y.o. female presenting to clinic today for:  1.  Hyperlipidemia Patient has been compliant with Lipitor 40 mg daily but then ran out of medications.  She was not aware that she was post to come back in for repeat fasting lipid panel liver function test after initiation of this medication, which was increased earlier this year.  She denies any intolerance to the medication.  No reports of muscle pain, jaundice.  2.  Constipation Patient reports she suddenly became constipated on Sunday.  She has been bloated and gassy and has had some lower abdominal discomfort.  Yesterday she used some MiraLAX and she is slightly better but she really has not had a full bowel movement as normally.  Last colonoscopy was performed in 2019.  She was recommended 5-year follow-up.  No rectal bleeding, no unplanned weight loss.   ROS: Per HPI  Allergies  Allergen Reactions   Salac [Salicylic Acid] Swelling   Aspirin Swelling   Ibuprofen Other (See Comments)    Pt. Was told not to take Ibuprofen, based on her anaphylactic reaction to ASA   Past Medical History:  Diagnosis Date   Allergy    Colon polyps    Diverticulosis    GERD (gastroesophageal reflux disease)    Hyperlipidemia    Obesity     Current Outpatient Medications:    atorvastatin (LIPITOR) 20 MG tablet, Take 2 tablets (40 mg total) by mouth daily., Disp: 180 tablet, Rfl: 3   calcium carbonate (OSCAL) 1500 (600 Ca) MG TABS tablet, Take 600 mg by mouth daily., Disp: , Rfl:    cetirizine (ZYRTEC) 10 MG tablet, Take 10 mg by mouth as needed. , Disp: , Rfl:    Coenzyme Q10 (CO Q-10) 200 MG CAPS, Take 1 capsule by mouth daily., Disp: , Rfl:    famotidine (PEPCID) 20 MG tablet, Take by mouth., Disp: , Rfl:    Multiple Vitamin (MULTIVITAMIN) tablet, Take 1 tablet by mouth daily., Disp: , Rfl:  Social History   Socioeconomic History   Marital  status: Married    Spouse name: Not on file   Number of children: 2   Years of education: Not on file   Highest education level: Not on file  Occupational History   Occupation: Retired Pharmacist, hospital  Tobacco Use   Smoking status: Never   Smokeless tobacco: Never  Vaping Use   Vaping Use: Never used  Substance and Sexual Activity   Alcohol use: Yes    Alcohol/week: 2.0 standard drinks    Types: 2 Glasses of wine per week   Drug use: No   Sexual activity: Not on file  Other Topics Concern   Not on file  Social History Narrative   Not on file   Social Determinants of Health   Financial Resource Strain: Not on file  Food Insecurity: Not on file  Transportation Needs: Not on file  Physical Activity: Not on file  Stress: Not on file  Social Connections: Not on file  Intimate Partner Violence: Not on file   Family History  Problem Relation Age of Onset   Colon polyps Mother    Atrial fibrillation Mother    Diabetes Paternal Grandmother    Cancer Father        lung and kidney   Heart disease Father    Colon cancer Neg Hx    Esophageal cancer Neg Hx    Liver cancer  Neg Hx    Pancreatic cancer Neg Hx    Rectal cancer Neg Hx    Stomach cancer Neg Hx     Objective: Office vital signs reviewed. BP 126/80   Pulse 85   Temp (!) 97.5 F (36.4 C)   Ht '5\' 4"'  (1.626 m)   Wt 217 lb 12.8 oz (98.8 kg)   SpO2 96%   BMI 37.39 kg/m   Physical Examination:  General: Awake, alert, well nourished, No acute distress HEENT: Normal; sclera white Cardio: regular rate and rhythm, S1S2 heard, no murmurs appreciated Pulm: clear to auscultation bilaterally, no wheezes, rhonchi or rales; normal work of breathing on room air GI: Obese, soft, non-tender, non-distended, bowel sounds present x4, no hepatomegaly, no splenomegaly, no masses  Assessment/ Plan: 70 y.o. female   Dyslipidemia - Plan: CMP14+EGFR, Lipid panel, TSH, atorvastatin (LIPITOR) 40 MG tablet  Morbid obesity (HCC) - Plan:  CMP14+EGFR, Lipid panel, TSH, Bayer DCA Hb A1c Waived  Constipation, unspecified constipation type  She will return for fasting labs.  Lipitor 40 mg renewed.  A1c added for morbid obesity  Uncertain etiology of constipation but is going to treat her with a constipation cleanout.  Handout was provided with instructions.  We discussed that a sudden change in bowel habits sometimes is concerning and that we should consider reevaluation with GI if symptoms or not improving or if she has any worrisome symptoms or signs that occur.  She was good understanding and will follow up as needed  No orders of the defined types were placed in this encounter.  No orders of the defined types were placed in this encounter.    Janora Norlander, DO [place] [phone]

## 2021-06-18 NOTE — Patient Instructions (Signed)
Make a lab appointment to come in for fasting labs in 3 months  Thank you for coming in to clinic today.  1. Your symptoms are consistent with Constipation, likely cause of your General Abdominal Pain / Cramping. 2. Start with Miralax sent to pharmacy. First dose 68g (4 capfuls) in 32oz water over 1 to 2 hours for clean out. Next day start 17g or 1 capful daily, may adjust dose up or down by half a capful every few days. Recommend to take this medicine daily for next 1-2 weeks, then may need to use it longer if needed. - Goal is to have soft regular bowel movement 1-3x daily, if too runny or diarrhea, then reduce dose of the medicine  Improve water intake, hydration will help Also recommend increased vegetables, fruits, fiber intake Can try daily Metamucil or Fiber supplement at pharmacy over the counter  Follow-up if symptoms are not improving with bowel movements, or if pain worsens, develop fevers, nausea, vomiting.  Please schedule a follow-up appointment with Dr Lajuana Ripple in 1 month to follow-up Constipation  If you have any other questions or concerns, please feel free to call the clinic to contact me. You may also schedule an earlier appointment if necessary.  However, if your symptoms get significantly worse, please go to the Emergency Department to seek immediate medical attention.

## 2021-07-10 ENCOUNTER — Encounter: Payer: Self-pay | Admitting: Family Medicine

## 2021-07-10 ENCOUNTER — Ambulatory Visit (INDEPENDENT_AMBULATORY_CARE_PROVIDER_SITE_OTHER): Payer: Medicare Other | Admitting: Family Medicine

## 2021-07-10 DIAGNOSIS — M5432 Sciatica, left side: Secondary | ICD-10-CM

## 2021-07-10 MED ORDER — TIZANIDINE HCL 4 MG PO TABS
4.0000 mg | ORAL_TABLET | Freq: Three times a day (TID) | ORAL | 0 refills | Status: DC | PRN
Start: 2021-07-10 — End: 2021-09-12

## 2021-07-10 MED ORDER — PREDNISONE 10 MG (21) PO TBPK: ORAL_TABLET | ORAL | 0 refills | Status: DC

## 2021-07-10 NOTE — Progress Notes (Signed)
Telephone visit  Subjective: CC: Sciatic PCP: Janora Norlander, DO [id number] Joyce Jenkins is a 70 y.o. female calls for telephone consult today. Patient provides verbal consent for consult held via phone.  Due to COVID-19 pandemic this visit was conducted virtually. This visit type was conducted due to national recommendations for restrictions regarding the COVID-19 Pandemic (e.g. social distancing, sheltering in place) in an effort to limit this patient's exposure and mitigate transmission in our community. All issues noted in this document were discussed and addressed.  A physical exam was not performed with this format.   Location of patient: home Location of provider: [facility] Others present for call: none  1. Sciatica Was on vacation in [place] and she developed severe sciatica and had to cut her trip short.  Sciatica is affecting left side.  She has been using ice and tylenol.  If she is seated symptoms are not severe but when she gets up from a seated position they become severe.  It impacted her gait.  She had this once before.  Unable to tolerate oral NSAIDs due to anaphylactic reaction to aspirin.   ROS: Per HPI  Allergies  Allergen Reactions   Salac [Salicylic Acid] Swelling   Aspirin Swelling   Ibuprofen Other (See Comments)    Pt. Was told not to take Ibuprofen, based on her anaphylactic reaction to ASA   Past Medical History:  Diagnosis Date   Allergy    Colon polyps    Diverticulosis    GERD (gastroesophageal reflux disease)    Hyperlipidemia    Obesity     Current Outpatient Medications:    atorvastatin (LIPITOR) 40 MG tablet, Take 1 tablet (40 mg total) by mouth daily., Disp: 90 tablet, Rfl: 3   calcium carbonate (OSCAL) 1500 (600 Ca) MG TABS tablet, Take 600 mg by mouth daily., Disp: , Rfl:    cetirizine (ZYRTEC) 10 MG tablet, Take 10 mg by mouth as needed. , Disp: , Rfl:    Coenzyme Q10 (CO Q-10) 200 MG CAPS, Take 1 capsule by mouth daily., Disp: , Rfl:     famotidine (PEPCID) 20 MG tablet, Take by mouth., Disp: , Rfl:    Multiple Vitamin (MULTIVITAMIN) tablet, Take 1 tablet by mouth daily., Disp: , Rfl:   Assessment/ Plan: 70 y.o. female   Sciatica, left side - Plan: tiZANidine (ZANAFLEX) 4 MG tablet, predniSONE (STERAPRED UNI-PAK 21 TAB) 10 MG (21) TBPK tablet  Steroids, muscle relaxer, heat.  Discussed red flag signs and symptoms.  If no palpable improvement by tomorrow may need to consider an office visit for intramuscular injection  Start time: 12:13pm End time: 12:18pm  Total time spent on patient care (including telephone call/ virtual visit): 5 minutes  [place], [place] [phone]

## 2021-07-10 NOTE — Telephone Encounter (Signed)
Patient called and appt given.

## 2021-07-10 NOTE — Telephone Encounter (Signed)
Ok to put her on a video/ televisit with me today.  I will call during lunch

## 2021-09-11 ENCOUNTER — Other Ambulatory Visit: Payer: Medicare Other

## 2021-09-11 ENCOUNTER — Other Ambulatory Visit: Payer: Self-pay

## 2021-09-11 DIAGNOSIS — E785 Hyperlipidemia, unspecified: Secondary | ICD-10-CM

## 2021-09-11 LAB — BAYER DCA HB A1C WAIVED: HB A1C (BAYER DCA - WAIVED): 6 % — ABNORMAL HIGH (ref 4.8–5.6)

## 2021-09-12 ENCOUNTER — Ambulatory Visit (INDEPENDENT_AMBULATORY_CARE_PROVIDER_SITE_OTHER): Payer: Medicare Other | Admitting: Family Medicine

## 2021-09-12 VITALS — BP 138/82 | HR 84 | Temp 97.6°F | Ht 64.0 in | Wt 216.0 lb

## 2021-09-12 DIAGNOSIS — R7989 Other specified abnormal findings of blood chemistry: Secondary | ICD-10-CM

## 2021-09-12 DIAGNOSIS — Z780 Asymptomatic menopausal state: Secondary | ICD-10-CM | POA: Diagnosis not present

## 2021-09-12 DIAGNOSIS — R7303 Prediabetes: Secondary | ICD-10-CM | POA: Diagnosis not present

## 2021-09-12 DIAGNOSIS — E785 Hyperlipidemia, unspecified: Secondary | ICD-10-CM | POA: Diagnosis not present

## 2021-09-12 LAB — CMP14+EGFR
ALT: 29 IU/L (ref 0–32)
AST: 26 IU/L (ref 0–40)
Albumin/Globulin Ratio: 2 (ref 1.2–2.2)
Albumin: 4.2 g/dL (ref 3.7–4.7)
Alkaline Phosphatase: 127 IU/L — ABNORMAL HIGH (ref 44–121)
BUN/Creatinine Ratio: 15 (ref 12–28)
BUN: 13 mg/dL (ref 8–27)
Bilirubin Total: 0.6 mg/dL (ref 0.0–1.2)
CO2: 25 mmol/L (ref 20–29)
Calcium: 9.2 mg/dL (ref 8.7–10.3)
Chloride: 101 mmol/L (ref 96–106)
Creatinine, Ser: 0.87 mg/dL (ref 0.57–1.00)
Globulin, Total: 2.1 g/dL (ref 1.5–4.5)
Glucose: 121 mg/dL — ABNORMAL HIGH (ref 70–99)
Potassium: 4.8 mmol/L (ref 3.5–5.2)
Sodium: 140 mmol/L (ref 134–144)
Total Protein: 6.3 g/dL (ref 6.0–8.5)
eGFR: 71 mL/min/{1.73_m2} (ref >59)

## 2021-09-12 LAB — LIPID PANEL
Chol/HDL Ratio: 2.8 ratio (ref 0.0–4.4)
Cholesterol, Total: 134 mg/dL (ref 100–199)
HDL: 48 mg/dL (ref >39)
LDL Chol Calc (NIH): 59 mg/dL (ref 0–99)
Triglycerides: 158 mg/dL — ABNORMAL HIGH (ref 0–149)
VLDL Cholesterol Cal: 27 mg/dL (ref 5–40)

## 2021-09-12 LAB — TSH: TSH: 4.9 u[IU]/mL — ABNORMAL HIGH (ref 0.450–4.500)

## 2021-09-12 NOTE — Progress Notes (Signed)
Subjective: CC: Follow-up abnormal lab PCP: Janora Norlander, DO OTL:XBWIOM MARTIE MUHLBAUER is a 70 y.o. female presenting to clinic today for:  1.  Hyperlipidemia Triglycerides noted to be elevated, A1c was in prediabetic range at 6.0.  Of note she was treated with oral corticosteroids for low back issues recently.  She does not report any chest pain, shortness of breath or abdominal pain  2.  Elevated TSH TSH known to be mildly elevated.  She has a history as a child of having some type of thyroid lesion resection.  She does not reporting difficulty swallowing, tremor or change in bowel habits.  No heart palpitations.   ROS: Per HPI  Allergies  Allergen Reactions   Salac [Salicylic Acid] Swelling   Aspirin Swelling   Ibuprofen Other (See Comments)    Pt. Was told not to take Ibuprofen, based on her anaphylactic reaction to ASA   Past Medical History:  Diagnosis Date   Allergy    Colon polyps    Diverticulosis    GERD (gastroesophageal reflux disease)    Hyperlipidemia    Obesity     Current Outpatient Medications:    atorvastatin (LIPITOR) 40 MG tablet, Take 1 tablet (40 mg total) by mouth daily., Disp: 90 tablet, Rfl: 3   calcium carbonate (OSCAL) 1500 (600 Ca) MG TABS tablet, Take 600 mg by mouth daily., Disp: , Rfl:    cetirizine (ZYRTEC) 10 MG tablet, Take 10 mg by mouth as needed. , Disp: , Rfl:    Coenzyme Q10 (CO Q-10) 200 MG CAPS, Take 1 capsule by mouth daily., Disp: , Rfl:    famotidine (PEPCID) 20 MG tablet, Take by mouth., Disp: , Rfl:    Multiple Vitamin (MULTIVITAMIN) tablet, Take 1 tablet by mouth daily., Disp: , Rfl:  Social History   Socioeconomic History   Marital status: Married    Spouse name: Not on file   Number of children: 2   Years of education: Not on file   Highest education level: Not on file  Occupational History   Occupation: Retired Pharmacist, hospital  Tobacco Use   Smoking status: Never   Smokeless tobacco: Never  Vaping Use   Vaping Use:  Never used  Substance and Sexual Activity   Alcohol use: Yes    Alcohol/week: 2.0 standard drinks    Types: 2 Glasses of wine per week   Drug use: No   Sexual activity: Not on file  Other Topics Concern   Not on file  Social History Narrative   Not on file   Social Determinants of Health   Financial Resource Strain: Not on file  Food Insecurity: Not on file  Transportation Needs: Not on file  Physical Activity: Not on file  Stress: Not on file  Social Connections: Not on file  Intimate Partner Violence: Not on file   Family History  Problem Relation Age of Onset   Colon polyps Mother    Atrial fibrillation Mother    Diabetes Paternal Grandmother    Cancer Father        lung and kidney   Heart disease Father    Colon cancer Neg Hx    Esophageal cancer Neg Hx    Liver cancer Neg Hx    Pancreatic cancer Neg Hx    Rectal cancer Neg Hx    Stomach cancer Neg Hx     Objective: Office vital signs reviewed. BP 138/82   Pulse 84   Temp 97.6 F (36.4 C) (Temporal)  Ht 5\' 4"  (1.626 m)   Wt 216 lb (98 kg)   SpO2 99%   BMI 37.08 kg/m   Physical Examination:  General: Awake, alert, well nourished, No acute distress HEENT: No palpable thyroid abnormalities.  No exophthalmos Cardio: regular rate and rhythm, S1S2 heard, no murmurs appreciated Pulm: clear to auscultation bilaterally, no wheezes, rhonchi or rales; normal work of breathing on room air Skin: dry; intact; no rashes or lesions; normal temperature Neuro: No tremor  Assessment/ Plan: 70 y.o. female   Abnormal TSH - Plan: TSH, T4, free  Dyslipidemia - Plan: TSH, T4, free  Pre-diabetes  Asymptomatic postmenopausal estrogen deficiency - Plan: DG [facility] DEXA  Check free T4, TSH in 3 to 4 weeks.  Future lab appointment scheduled for patient so that she will be reminded  We discussed that her triglycerides were slightly elevated but this may be reflective of the elevated blood sugar she has been experiencing the  setting of recent steroid use.  We will anticipate repeat A1c and lipid panel in 3 months  Plan for DEXA with her lab draw in about 4 weeks.  Future order has been placed.  Questionable osteopenia noted on previous  No orders of the defined types were placed in this encounter.  No orders of the defined types were placed in this encounter.    Janora Norlander, DO [place] [phone]

## 2021-10-13 ENCOUNTER — Other Ambulatory Visit: Payer: Medicare Other

## 2021-10-13 ENCOUNTER — Ambulatory Visit (INDEPENDENT_AMBULATORY_CARE_PROVIDER_SITE_OTHER): Payer: Medicare Other

## 2021-10-13 DIAGNOSIS — Z780 Asymptomatic menopausal state: Secondary | ICD-10-CM

## 2021-10-13 DIAGNOSIS — R7989 Other specified abnormal findings of blood chemistry: Secondary | ICD-10-CM

## 2021-10-13 DIAGNOSIS — E785 Hyperlipidemia, unspecified: Secondary | ICD-10-CM

## 2021-10-14 LAB — T4, FREE: Free T4: 1.27 ng/dL (ref 0.82–1.77)

## 2021-10-14 LAB — TSH: TSH: 3.78 u[IU]/mL (ref 0.450–4.500)

## 2022-01-12 ENCOUNTER — Encounter: Payer: Self-pay | Admitting: Family Medicine

## 2022-01-12 ENCOUNTER — Ambulatory Visit (INDEPENDENT_AMBULATORY_CARE_PROVIDER_SITE_OTHER): Payer: Medicare Other | Admitting: Family Medicine

## 2022-01-12 VITALS — BP 134/82 | HR 73 | Temp 97.5°F | Ht 64.0 in | Wt 212.0 lb

## 2022-01-12 DIAGNOSIS — R7303 Prediabetes: Secondary | ICD-10-CM

## 2022-01-12 DIAGNOSIS — E785 Hyperlipidemia, unspecified: Secondary | ICD-10-CM | POA: Diagnosis not present

## 2022-01-12 LAB — BAYER DCA HB A1C WAIVED: HB A1C (BAYER DCA - WAIVED): 5.8 % — ABNORMAL HIGH (ref 4.8–5.6)

## 2022-01-12 NOTE — Progress Notes (Signed)
? ?Subjective: ?[id number] ?PCP: Janora Norlander, DO ?ENI:DPOEUM Joyce Jenkins is a 71 y.o. female presenting to clinic today for: ? ?1. Elevated lipid, PreDM ?She is exercising regularly and tries to maintain a good diet.  She has not seen the orthopedist since before last visit but is noticing that she may need a corticosteroid injection in her right knee soon.  She continues to struggle with right-sided knee pain and mid to left-sided sciatica.  The sciatica is relieved by 30 minutes in her chair each day.  Denies any chest pain, jaundice, shortness of breath.  She is compliant with her Lipitor. ? ? ?ROS: Per HPI ? ?Allergies  ?Allergen Reactions  ? Salac [Salicylic Acid] Swelling  ? Aspirin Swelling  ? Ibuprofen Other (See Comments)  ?  Pt. Was told not to take Ibuprofen, based on her anaphylactic reaction to ASA  ? ?Past Medical History:  ?Diagnosis Date  ? Allergy   ? Colon polyps   ? Diverticulosis   ? GERD (gastroesophageal reflux disease)   ? Hyperlipidemia   ? Obesity   ? ? ?Current Outpatient Medications:  ?  atorvastatin (LIPITOR) 40 MG tablet, Take 1 tablet (40 mg total) by mouth daily., Disp: 90 tablet, Rfl: 3 ?  calcium carbonate (OSCAL) 1500 (600 Ca) MG TABS tablet, Take 600 mg by mouth daily., Disp: , Rfl:  ?  cetirizine (ZYRTEC) 10 MG tablet, Take 10 mg by mouth as needed. , Disp: , Rfl:  ?  Coenzyme Q10 (CO Q-10) 200 MG CAPS, Take 1 capsule by mouth daily., Disp: , Rfl:  ?  famotidine (PEPCID) 20 MG tablet, Take by mouth., Disp: , Rfl:  ?  Multiple Vitamin (MULTIVITAMIN) tablet, Take 1 tablet by mouth daily., Disp: , Rfl:  ?Social History  ? ?Socioeconomic History  ? Marital status: Married  ?  Spouse name: Not on file  ? Number of children: 2  ? Years of education: Not on file  ? Highest education level: Not on file  ?Occupational History  ? Occupation: Retired Pharmacist, hospital  ?Tobacco Use  ? Smoking status: Never  ? Smokeless tobacco: Never  ?Vaping Use  ? Vaping Use: Never used  ?Substance and  Sexual Activity  ? Alcohol use: Yes  ?  Alcohol/week: 2.0 standard drinks  ?  Types: 2 Glasses of wine per week  ? Drug use: No  ? Sexual activity: Not on file  ?Other Topics Concern  ? Not on file  ?Social History Narrative  ? Not on file  ? ?Social Determinants of Health  ? ?Financial Resource Strain: Not on file  ?Food Insecurity: Not on file  ?Transportation Needs: Not on file  ?Physical Activity: Not on file  ?Stress: Not on file  ?Social Connections: Not on file  ?Intimate Partner Violence: Not on file  ? ?Family History  ?Problem Relation Age of Onset  ? Colon polyps Mother   ? Atrial fibrillation Mother   ? Diabetes Paternal Grandmother   ? Cancer Father   ?     lung and kidney  ? Heart disease Father   ? Colon cancer Neg Hx   ? Esophageal cancer Neg Hx   ? Liver cancer Neg Hx   ? Pancreatic cancer Neg Hx   ? Rectal cancer Neg Hx   ? Stomach cancer Neg Hx   ? ? ?Objective: ?Office vital signs reviewed. ?BP 134/82   Pulse 73   Temp (!) 97.5 ?F (36.4 ?C)   Ht '5\' 4"'  (1.626 m)  Wt 212 lb (96.2 kg)   SpO2 100%   BMI 36.39 kg/m?  ? ?Physical Examination:  ?General: Awake, alert, obese, No acute distress ?HEENT: No carotid bruits.  Sclera white ?Cardio: regular rate and rhythm, S1S2 heard, no murmurs appreciated ?Pulm: clear to auscultation bilaterally, no wheezes, rhonchi or rales; normal work of breathing on room air ?Extremities: warm, well perfused, No edema, cyanosis or clubbing; +2 pulses bilaterally.  Varicose veins present on the ankle on the left ?MSK: Ambulating independently ? ?Assessment/ Plan: ?71 y.o. female  ? ?Dyslipidemia - Plan: CMP14+EGFR, Lipid Panel ? ?Pre-diabetes - Plan: Bayer DCA Hb A1c Waived ? ?Morbid obesity ([place]) - Plan: Bayer DCA Hb A1c Waived, CMP14+EGFR, Lipid Panel ? ?Had coffee this morning but otherwise fasting.  These labs have been repeated.  We will take a closer look at her triglycerides.  May need to consider addition of fish oils ? ?Still the prediabetic range but A1c  downtrending, which is reassuring. ? ?Continue lifestyle modification to promote weight loss ? ?No orders of the defined types were placed in this encounter. ? ?No orders of the defined types were placed in this encounter. ? ? ? ?Janora Norlander, DO ?[place] ?[phone] ? ?

## 2022-01-12 NOTE — Patient Instructions (Addendum)
We'll see what the triglycerides look like this go around since you have not had any steroids since before our last appointment.  Your A1c is still in the prediabetic range at 5.8 but this has come down from 6.0 in November.  You are moving in the right direction. ? ?You had labs performed today.  You will be contacted with the results of the labs once they are available, usually in the next 3 business days for routine lab work.  If you have an active my chart account, they will be released to your MyChart.  If you prefer to have these labs released to you via telephone, please let us know. ? ?  ? ?

## 2022-01-13 LAB — CMP14+EGFR
ALT: 35 IU/L — ABNORMAL HIGH (ref 0–32)
AST: 26 IU/L (ref 0–40)
Albumin/Globulin Ratio: 2 (ref 1.2–2.2)
Albumin: 4.3 g/dL (ref 3.7–4.7)
Alkaline Phosphatase: 135 IU/L — ABNORMAL HIGH (ref 44–121)
BUN/Creatinine Ratio: 17 (ref 12–28)
BUN: 14 mg/dL (ref 8–27)
Bilirubin Total: 0.7 mg/dL (ref 0.0–1.2)
CO2: 25 mmol/L (ref 20–29)
Calcium: 10 mg/dL (ref 8.7–10.3)
Chloride: 103 mmol/L (ref 96–106)
Creatinine, Ser: 0.84 mg/dL (ref 0.57–1.00)
Globulin, Total: 2.2 g/dL (ref 1.5–4.5)
Glucose: 125 mg/dL — ABNORMAL HIGH (ref 70–99)
Potassium: 5 mmol/L (ref 3.5–5.2)
Sodium: 143 mmol/L (ref 134–144)
Total Protein: 6.5 g/dL (ref 6.0–8.5)
eGFR: 74 mL/min/{1.73_m2} (ref >59)

## 2022-01-13 LAB — LIPID PANEL
Chol/HDL Ratio: 2.6 ratio (ref 0.0–4.4)
Cholesterol, Total: 135 mg/dL (ref 100–199)
HDL: 52 mg/dL (ref >39)
LDL Chol Calc (NIH): 59 mg/dL (ref 0–99)
Triglycerides: 136 mg/dL (ref 0–149)
VLDL Cholesterol Cal: 24 mg/dL (ref 5–40)

## 2022-02-12 ENCOUNTER — Other Ambulatory Visit: Payer: Self-pay | Admitting: Family Medicine

## 2022-02-12 DIAGNOSIS — Z1231 Encounter for screening mammogram for malignant neoplasm of breast: Secondary | ICD-10-CM

## 2022-03-30 ENCOUNTER — Ambulatory Visit
Admission: RE | Admit: 2022-03-30 | Discharge: 2022-03-30 | Disposition: A | Discharge status: Home / Self Care | Payer: Medicare Other | Source: Ambulatory Visit | Attending: Family Medicine | Admitting: Family Medicine

## 2022-03-30 DIAGNOSIS — Z1231 Encounter for screening mammogram for malignant neoplasm of breast: Secondary | ICD-10-CM

## 2022-04-22 ENCOUNTER — Ambulatory Visit (INDEPENDENT_AMBULATORY_CARE_PROVIDER_SITE_OTHER): Payer: Medicare Other

## 2022-04-22 VITALS — Wt 212.0 lb

## 2022-04-22 DIAGNOSIS — Z0000 Encounter for general adult medical examination without abnormal findings: Secondary | ICD-10-CM | POA: Diagnosis not present

## 2022-04-22 NOTE — Progress Notes (Signed)
Subjective:   Joyce Jenkins is a 71 y.o. female who presents for Medicare Annual (Subsequent) preventive examination.  Virtual Visit via Telephone Note  I connected with  Joyce Jenkins on 04/22/22 at  2:45 PM EDT by telephone and verified that I am speaking with the correct person using two identifiers.  Location: Patient: Home Provider: [facility] Persons participating in the virtual visit: patient/Nurse Health Advisor   I discussed the limitations, risks, security and privacy concerns of performing an evaluation and management service by telephone and the availability of in person appointments. The patient expressed understanding and agreed to proceed.  Interactive audio and video telecommunications were attempted between this nurse and patient, however failed, due to patient having technical difficulties OR patient did not have access to video capability.  We continued and completed visit with audio only.  Some vital signs may be absent or patient reported.   Joyce Jenkins E Joyce Bradwell, LPN   Review of Systems     Cardiac Risk Factors include: advanced age (>29mn, >>59women);obesity (BMI >30kg/m2);sedentary lifestyle;dyslipidemia     Objective:    Today's Vitals   04/22/22 1506  Weight: 212 lb (96.2 kg)   Body mass index is 36.39 kg/m.     04/22/2022    3:10 PM 04/14/2021   11:19 AM 03/05/2020    9:49 AM 02/16/2018   10:19 AM  Advanced Directives  Does Patient Have a Medical Advance Directive? No No No No  Would patient like information on creating a medical advance directive? No - Patient declined No - Patient declined No - Patient declined No - Patient declined    Current Medications (verified) Outpatient Encounter Medications as of 04/22/2022  Medication Sig   atorvastatin (LIPITOR) 40 MG tablet Take 1 tablet (40 mg total) by mouth daily.   calcium carbonate (OSCAL) 1500 (600 Ca) MG TABS tablet Take 600 mg by mouth daily.   cetirizine (ZYRTEC) 10 MG tablet Take 10 mg by mouth  as needed.    Coenzyme Q10 (CO Q-10) 200 MG CAPS Take 1 capsule by mouth daily.   famotidine (PEPCID) 20 MG tablet Take by mouth.   Multiple Vitamin (MULTIVITAMIN) tablet Take 1 tablet by mouth daily.   No facility-administered encounter medications on file as of 04/22/2022.    Allergies (verified) Salac [salicylic acid], Aspirin, and Ibuprofen   History: Past Medical History:  Diagnosis Date   Allergy    Colon polyps    Diverticulosis    GERD (gastroesophageal reflux disease)    Hyperlipidemia    Obesity    Past Surgical History:  Procedure Laterality Date   CHOLECYSTECTOMY     THYROID CYST EXCISION     Family History  Problem Relation Age of Onset   Colon polyps Mother    Atrial fibrillation Mother    Diabetes Paternal Grandmother    Cancer Father        lung and kidney   Heart disease Father    Colon cancer Neg Hx    Esophageal cancer Neg Hx    Liver cancer Neg Hx    Pancreatic cancer Neg Hx    Rectal cancer Neg Hx    Stomach cancer Neg Hx    Social History   Socioeconomic History   Marital status: Married    Spouse name: Joyce Jenkins  Number of children: 2   Years of education: Not on file   Highest education level: Not on file  Occupational History   Occupation: Retired tPharmacist, hospital Tobacco  Use   Smoking status: Never   Smokeless tobacco: Never  Vaping Use   Vaping Use: Never used  Substance and Sexual Activity   Alcohol use: Yes    Alcohol/week: 2.0 standard drinks of alcohol    Types: 2 Glasses of wine per week   Drug use: No   Sexual activity: Not on file  Other Topics Concern   Not on file  Social History Narrative   Not on file   Social Determinants of Health   Financial Resource Strain: Low Risk  (04/22/2022)   Overall Financial Resource Strain (CARDIA)    Difficulty of Paying Living Expenses: Not hard at all  Food Insecurity: No Food Insecurity (04/22/2022)   Hunger Vital Sign    Worried About Running Out of Food in the Last Year: Never true     Ran Out of Food in the Last Year: Never true  Transportation Needs: No Transportation Needs (04/22/2022)   PRAPARE - Hydrologist (Medical): No    Lack of Transportation (Non-Medical): No  Physical Activity: Insufficiently Active (04/22/2022)   Exercise Vital Sign    Days of Exercise per Week: 2 days    Minutes of Exercise per Session: 50 min  Stress: No Stress Concern Present (04/22/2022)   [place]    Feeling of Stress : Not at all  Social Connections: [place] (04/22/2022)   Social Connection and Isolation Panel [NHANES]    Frequency of Communication with Friends and Family: More than three times a week    Frequency of Social Gatherings with Friends and Family: More than three times a week    Attends Religious Services: More than 4 times per year    Active Member of [employer] or Organizations: Yes    Attends Music therapist: More than 4 times per year    Marital Status: Married    Tobacco Counseling Counseling given: Not Answered   Clinical Intake:  Pre-visit preparation completed: Yes  Pain : No/denies pain     BMI - recorded: 36.39 Nutritional Status: BMI > 30  Obese Nutritional Risks: None Diabetes: No  How often do you need to have someone help you when you read instructions, pamphlets, or other written materials from your doctor or pharmacy?: 1 - Never  Diabetic? no  Interpreter Needed?: No  Information entered by :: Joyce Michon, LPN   Activities of Daily Living    04/22/2022    3:10 PM  In your present state of health, do you have any difficulty performing the following activities:  Hearing? 0  Vision? 0  Difficulty concentrating or making decisions? 0  Walking or climbing stairs? 0  Dressing or bathing? 0  Doing errands, shopping? 0  Preparing Food and eating ? N  Using the Toilet? N  In the past six months, have you accidently leaked  urine? Y  Comment wears pads for protection  Do you have problems with loss of bowel control? N  Managing your Medications? N  Managing your Finances? N  Housekeeping or managing your Housekeeping? N    Patient Care Team: Joyce Norlander, DO as PCP - General (Family Medicine)  Indicate any recent Medical Services you may have received from other than [facility] providers in the past year (date may be approximate).     Assessment:   This is a routine wellness examination for Joyce Jenkins.  Hearing/Vision screen Hearing Screening - Comments:: Denies hearing difficulties  Vision Screening - Comments:: Wears rx glasses - up to date with routine eye exams with [facility]  Dietary issues and exercise activities discussed: Current Exercise Habits: Home exercise routine, Type of exercise: walking;strength training/weights;calisthenics, Time (Minutes): 45, Frequency (Times/Week): 2, Weekly Exercise (Minutes/Week): 90, Intensity: Moderate, Exercise limited by: None identified   Goals Addressed             This Visit's Progress    Exercise 150 min/wk Moderate Activity   Not on track    Patient Stated   On track    04/22/2022 AWV Goal: Fall Prevention  Over the next year, patient will decrease their risk for falls by: Using assistive devices, such as a cane or walker, as needed Identifying fall risks within their home and correcting them by: Removing throw rugs Adding handrails to stairs or ramps Removing clutter and keeping a clear pathway throughout the home Increasing light, especially at night Adding shower handles/bars Raising toilet seat Identifying potential personal risk factors for falls: Medication side effects Incontinence/urgency Vestibular dysfunction Hearing loss Musculoskeletal disorders Neurological disorders Orthostatic hypotension         Depression Screen    04/22/2022    3:09 PM 01/12/2022    9:54 AM 09/12/2021    3:04 PM 06/18/2021    3:37 PM 04/14/2021    11:24 AM 04/10/2020   11:03 AM 03/05/2020    9:49 AM  PHQ 2/9 Scores  PHQ - 2 Score 0 0 0 0 0 0 0  PHQ- 9 Score  0    0     Fall Risk    04/22/2022    3:07 PM 01/12/2022    9:54 AM 09/12/2021    3:03 PM 06/18/2021    3:37 PM 04/14/2021   11:23 AM  [place] in the past year? 0 0 1 0 0  Number falls in past yr: 0  0    Injury with Fall? 0  0    Risk for fall due to : Orthopedic patient  History of fall(s)    Follow up Falls prevention discussed  Falls prevention discussed  Falls evaluation completed    [place]:  Any stairs in or around the home? No  If so, are there any without handrails? No  Home free of loose throw rugs in walkways, pet beds, electrical cords, etc? Yes  Adequate lighting in your home to reduce risk of falls? Yes   ASSISTIVE DEVICES UTILIZED TO PREVENT FALLS:  Life alert? No  Use of a cane, walker or w/c? No  Grab bars in the bathroom? No  Shower chair or bench in shower? No  Elevated toilet seat or a handicapped toilet? No   TIMED UP AND GO:  Was the test performed? No . Telephonic visit  Cognitive Function:        04/22/2022    3:16 PM 04/14/2021   11:22 AM 03/05/2020    9:51 AM  6CIT Screen  What Year? 0 points 0 points 0 points  What month? 0 points 0 points 0 points  What time? 0 points 0 points 0 points  Count back from 20 0 points 0 points 0 points  Months in reverse 0 points 0 points 0 points  Repeat phrase 0 points 0 points 0 points  Total Score 0 points 0 points 0 points    Immunizations Immunization History  Administered Date(s) Administered   Pneumococcal Conjugate-13 09/24/2016   Pneumococcal Polysaccharide-23 07/15/2015  Tdap 04/04/2013    TDAP status: Up to date  Flu Vaccine status: Declined, Education has been provided regarding the importance of this vaccine but patient still declined. Advised may receive this vaccine at local pharmacy or Health Dept. Aware to provide a copy  of the vaccination record if obtained from local pharmacy or Health Dept. Verbalized acceptance and understanding.  Pneumococcal vaccine status: Up to date  Covid-19 vaccine status: Declined, Education has been provided regarding the importance of this vaccine but patient still declined. Advised may receive this vaccine at local pharmacy or Health Dept.or vaccine clinic. Aware to provide a copy of the vaccination record if obtained from local pharmacy or Health Dept. Verbalized acceptance and understanding.  Qualifies for Shingles Vaccine? Yes   Zostavax completed No   Shingrix Completed?: No.    Education has been provided regarding the importance of this vaccine. Patient has been advised to call insurance company to determine out of pocket expense if they have not yet received this vaccine. Advised may also receive vaccine at local pharmacy or Health Dept. Verbalized acceptance and understanding.  Screening Tests Health Maintenance  Topic Date Due   Zoster Vaccines- Shingrix (1 of 2) 07/23/2022 (Originally 06/04/1970)   COLONOSCOPY (Pts 45-26yr Insurance coverage will need to be confirmed)  02/17/2023   MAMMOGRAM  03/31/2023   TETANUS/TDAP  04/05/2023   DEXA SCAN  10/17/2023   Pneumonia Vaccine 72 Years old  Completed   Hepatitis C Screening  Completed   HPV VACCINES  Aged Out   INFLUENZA VACCINE  Discontinued   COVID-19 Vaccine  Discontinued    Health Maintenance  There are no preventive care reminders to display for this patient.   Colorectal cancer screening: Type of screening: Colonoscopy. Completed 02/16/2018. Repeat every 5 years  Mammogram status: Completed 03/30/2022. Repeat every year  Bone Density status: Completed 10/16/2021. Results reflect: Bone density results: OSTEOPENIA. Repeat every 2 years.  Lung Cancer Screening: (Low Dose CT Chest recommended if Age 72-80years, 30 pack-year currently smoking OR have quit w/in 15years.) does not qualify.   Additional  Screening:  Hepatitis C Screening: does qualify; Completed 10/16/2019  Vision Screening: Recommended annual ophthalmology exams for early detection of glaucoma and other disorders of the eye. Is the patient up to date with their annual eye exam?  Yes  Who is the provider or what is the name of the office in which the patient attends annual eye exams? [facility] If pt is not established with a provider, would they like to be referred to a provider to establish care? No .   Dental Screening: Recommended annual dental exams for proper oral hygiene  Community Resource Referral / Chronic Care Management: CRR required this visit?  No   CCM required this visit?  No      Plan:     I have personally reviewed and noted the following in the patient's chart:   Medical and social history Use of alcohol, tobacco or illicit drugs  Current medications and supplements including opioid prescriptions.  Functional ability and status Nutritional status Physical activity Advanced directives List of other physicians Hospitalizations, surgeries, and ER visits in previous 12 months Vitals Screenings to include cognitive, depression, and falls Referrals and appointments  In addition, I have reviewed and discussed with patient certain preventive protocols, quality metrics, and best practice recommendations. A written personalized care plan for preventive services as well as general preventive health recommendations were provided to patient.     Tametra Ahart E Admire Bunnell,  LPN   [id number]   Nurse Notes: None

## 2022-04-22 NOTE — Patient Instructions (Signed)
Joyce Jenkins , Thank you for taking time to come for your Medicare Wellness Visit. I appreciate your ongoing commitment to your health goals. Please review the following plan we discussed and let me know if I can assist you in the future.   Screening recommendations/referrals: Colonoscopy: Done 02/16/2018 - Repeat in 5 years Mammogram: Done 03/30/2022 - Repeat annually Bone Density: Done 10/16/2021 - Repeat every 2 years Recommended yearly ophthalmology/optometry visit for glaucoma screening and checkup Recommended yearly dental visit for hygiene and checkup  Vaccinations: Influenza vaccine: Declined - recommended every fall Pneumococcal vaccine: Done 07/15/2015 & 09/24/2016  Tdap vaccine: Done 04/04/2013 - Repeat in 10 years Shingles vaccine: Declined - Shingrix is 2 doses 2-6 months apart and over 90% effective     Covid-19: Declined - recommend 2 doses one month apart followed by booster 6 months later  Advanced directives: Advance directive discussed with you today. Even though you declined this today, please call our office should you change your mind, and we can give you the proper paperwork for you to fill out.   Conditions/risks identified: Keep up the great work exercising twice per week! Aim for 30 minutes of exercise or brisk walking, 6-8 glasses of water, and 5 servings of fruits and vegetables each day.   Next appointment: Follow up in one year for your annual wellness visit    Preventive Care 65 Years and Older, Female Preventive care refers to lifestyle choices and visits with your health care provider that can promote health and wellness. What does preventive care include? A yearly physical exam. This is also called an annual well check. Dental exams once or twice a year. Routine eye exams. Ask your health care provider how often you should have your eyes checked. Personal lifestyle choices, including: Daily care of your teeth and gums. Regular physical activity. Eating a  healthy diet. Avoiding tobacco and drug use. Limiting alcohol use. Practicing safe sex. Taking low-dose aspirin every day. Taking vitamin and mineral supplements as recommended by your health care provider. What happens during an annual well check? The services and screenings done by your health care provider during your annual well check will depend on your age, overall health, lifestyle risk factors, and family history of disease. Counseling  Your health care provider may ask you questions about your: Alcohol use. Tobacco use. Drug use. Emotional well-being. Home and relationship well-being. Sexual activity. Eating habits. History of falls. Memory and ability to understand (cognition). Work and work Statistician. Reproductive health. Screening  You may have the following tests or measurements: Height, weight, and BMI. Blood pressure. Lipid and cholesterol levels. These may be checked every 5 years, or more frequently if you are over 10 years old. Skin check. Lung cancer screening. You may have this screening every year starting at age 38 if you have a 30-pack-year history of smoking and currently smoke or have quit within the past 15 years. Fecal occult blood test (FOBT) of the stool. You may have this test every year starting at age 55. Flexible sigmoidoscopy or colonoscopy. You may have a sigmoidoscopy every 5 years or a colonoscopy every 10 years starting at age 53. Hepatitis C blood test. Hepatitis B blood test. Sexually transmitted disease (STD) testing. Diabetes screening. This is done by checking your blood sugar (glucose) after you have not eaten for a while (fasting). You may have this done every 1-3 years. Bone density scan. This is done to screen for osteoporosis. You may have this done starting at age  65. Mammogram. This may be done every 1-2 years. Talk to your health care provider about how often you should have regular mammograms. Talk with your health care  provider about your test results, treatment options, and if necessary, the need for more tests. Vaccines  Your health care provider may recommend certain vaccines, such as: Influenza vaccine. This is recommended every year. Tetanus, diphtheria, and acellular pertussis (Tdap, Td) vaccine. You may need a Td booster every 10 years. Zoster vaccine. You may need this after age 29. Pneumococcal 13-valent conjugate (PCV13) vaccine. One dose is recommended after age 62. Pneumococcal polysaccharide (PPSV23) vaccine. One dose is recommended after age 34. Talk to your health care provider about which screenings and vaccines you need and how often you need them. This information is not intended to replace advice given to you by your health care provider. Make sure you discuss any questions you have with your health care provider. Document Released: 11/08/2015 Document Revised: 07/01/2016 Document Reviewed: 08/13/2015 Elsevier Interactive Patient Education  2017 [place] Prevention in the Home Falls can cause injuries. They can happen to people of all ages. There are many things you can do to make your home safe and to help prevent falls. What can I do on the outside of my home? Regularly fix the edges of walkways and driveways and fix any cracks. Remove anything that might make you trip as you walk through a door, such as a raised step or threshold. Trim any bushes or trees on the path to your home. Use bright outdoor lighting. Clear any walking paths of anything that might make someone trip, such as rocks or tools. Regularly check to see if handrails are loose or broken. Make sure that both sides of any steps have handrails. Any raised decks and porches should have guardrails on the edges. Have any leaves, snow, or ice cleared regularly. Use sand or salt on walking paths during winter. Clean up any spills in your garage right away. This includes oil or grease spills. What can I do in the  bathroom? Use night lights. Install grab bars by the toilet and in the tub and shower. Do not use towel bars as grab bars. Use non-skid mats or decals in the tub or shower. If you need to sit down in the shower, use a plastic, non-slip stool. Keep the floor dry. Clean up any water that spills on the floor as soon as it happens. Remove soap buildup in the tub or shower regularly. Attach bath mats securely with double-sided non-slip rug tape. Do not have throw rugs and other things on the floor that can make you trip. What can I do in the bedroom? Use night lights. Make sure that you have a light by your bed that is easy to reach. Do not use any sheets or blankets that are too big for your bed. They should not hang down onto the floor. Have a firm chair that has side arms. You can use this for support while you get dressed. Do not have throw rugs and other things on the floor that can make you trip. What can I do in the kitchen? Clean up any spills right away. Avoid walking on wet floors. Keep items that you use a lot in easy-to-reach places. If you need to reach something above you, use a strong step stool that has a grab bar. Keep electrical cords out of the way. Do not use floor polish or wax that makes floors slippery.  If you must use wax, use non-skid floor wax. Do not have throw rugs and other things on the floor that can make you trip. What can I do with my stairs? Do not leave any items on the stairs. Make sure that there are handrails on both sides of the stairs and use them. Fix handrails that are broken or loose. Make sure that handrails are as long as the stairways. Check any carpeting to make sure that it is firmly attached to the stairs. Fix any carpet that is loose or worn. Avoid having throw rugs at the top or bottom of the stairs. If you do have throw rugs, attach them to the floor with carpet tape. Make sure that you have a light switch at the top of the stairs and the  bottom of the stairs. If you do not have them, ask someone to add them for you. What else can I do to help prevent falls? Wear shoes that: Do not have high heels. Have rubber bottoms. Are comfortable and fit you well. Are closed at the toe. Do not wear sandals. If you use a stepladder: Make sure that it is fully opened. Do not climb a closed stepladder. Make sure that both sides of the stepladder are locked into place. Ask someone to hold it for you, if possible. Clearly mark and make sure that you can see: Any grab bars or handrails. First and last steps. Where the edge of each step is. Use tools that help you move around (mobility aids) if they are needed. These include: Canes. Walkers. Scooters. Crutches. Turn on the lights when you go into a dark area. Replace any light bulbs as soon as they burn out. Set up your furniture so you have a clear path. Avoid moving your furniture around. If any of your floors are uneven, fix them. If there are any pets around you, be aware of where they are. Review your medicines with your doctor. Some medicines can make you feel dizzy. This can increase your chance of falling. Ask your doctor what other things that you can do to help prevent falls. This information is not intended to replace advice given to you by your health care provider. Make sure you discuss any questions you have with your health care provider. Document Released: 08/08/2009 Document Revised: 03/19/2016 Document Reviewed: 11/16/2014 Elsevier Interactive Patient Education  2017 [employer].

## 2022-05-01 ENCOUNTER — Ambulatory Visit (INDEPENDENT_AMBULATORY_CARE_PROVIDER_SITE_OTHER): Payer: Medicare Other | Admitting: Physician Assistant

## 2022-05-01 ENCOUNTER — Encounter: Payer: Self-pay | Admitting: Family Medicine

## 2022-05-01 ENCOUNTER — Encounter: Payer: Self-pay | Admitting: Physician Assistant

## 2022-05-01 VITALS — BP 131/71 | HR 83 | Temp 97.7°F | Ht 64.0 in | Wt 212.8 lb

## 2022-05-01 DIAGNOSIS — W57XXXA Bitten or stung by nonvenomous insect and other nonvenomous arthropods, initial encounter: Secondary | ICD-10-CM | POA: Diagnosis not present

## 2022-05-01 DIAGNOSIS — S40862A Insect bite (nonvenomous) of left upper arm, initial encounter: Secondary | ICD-10-CM

## 2022-05-01 MED ORDER — METHYLPREDNISOLONE ACETATE 80 MG/ML IJ SUSP
80.0000 mg | Freq: Once | INTRAMUSCULAR | Status: AC
  Administered 2022-05-01: 80 mg via INTRAMUSCULAR

## 2022-05-01 NOTE — Progress Notes (Signed)
  Subjective:     Patient ID: Joyce Jenkins, female   DOB: [birth year], 71 y.o.   MRN: [id number]  HPI Horsefly bite x 2 to the L arm while at the pool Now with swelling and redness to the entire arm Hx of sig rxn prev   Review of Systems  Respiratory: Negative.    Cardiovascular: Negative.   Skin:  Positive for color change.       Objective:   Physical Exam Vitals and nursing note reviewed.  Constitutional:      General: She is not in acute distress.    Appearance: She is not ill-appearing or toxic-appearing.  Neurological:     Mental Status: She is alert.   + erythema and edema to most of the L arm Good pulses/sensory FROM of the arm,elbow,wrist     Assessment:     1. Nonvenomous insect bite of left upper arm without infection, initial encounter        Plan:     DepoMedrol 80 IM in the office today Continue with Zyrtec in the am and Benadryl qhs Cool compresses Move rings to the R hand until swelling resolves Also would like for her to discuss Epi Pen with her regular provider since her sx seem progressive with each bite PATP

## 2022-05-01 NOTE — Patient Instructions (Signed)
Insect Bite, Adult An insect bite can make your skin red, itchy, and swollen. Some insects can spread disease to people with a bite. However, most insect bites do not lead to disease, and most are not serious. What are the causes? Insects may bite for many reasons, including: Hunger. To defend themselves. Insects that bite include: Spiders. Mosquitoes. Ticks. Fleas. Ants. Flies. Kissing bugs. Chiggers. What are the signs or symptoms? Symptoms of this condition include: Itching or pain in the bite area. Redness and swelling in the bite area. An open wound (skin ulcer). Symptoms often last for 2-4 days. In rare cases, a person may have a very bad allergic reaction (anaphylactic reaction) to a bite. Symptoms of an anaphylactic reaction may include: Feeling warm in the face (flushed). Your face may turn red. Itchy, red, swollen areas of skin (hives). Swelling of the: Eyes. Lips. Face. Mouth. Tongue. Throat. Trouble with any of these: Breathing. Talking. Swallowing. Loud breathing (wheezing). Feeling dizzy or light-headed. Passing out (fainting). Pain or cramps in your belly. Throwing up (vomiting). Watery poop (diarrhea). How is this treated? Treatment is usually not needed. Symptoms often go away on their own. When treatment is needed, it may involve: Putting a cream or lotion on the bite area. This helps with itching. Taking an antibiotic medicine. This treatment is needed if the bite area gets infected. Getting a tetanus shot, if you are not up to date on this vaccine. Putting ice on the affected area. Using medicines called antihistamines. This treatment may be needed if you have itching or an allergic reaction to the insect bite. Giving yourself a shot of medicine (epinephrine) using an auto-injector "pen" if you have an anaphylactic reaction to a bite. Your doctor will teach you how to use this pen. Follow these instructions at home: Bite area care  Do not  scratch the bite area. Keep the bite area clean and dry. Wash the bite area every day with soap and water as told by your doctor. Check the bite area every day for signs of infection. Check for: Redness, swelling, or pain. Fluid or blood. Warmth. Pus or a bad smell. Managing pain, itching, and swelling  You may put any of these on the bite area as told by your doctor: A paste made of baking soda and water. Cortisone cream. Calamine lotion. If told, put ice on the bite area. Put ice in a plastic bag. Place a towel between your skin and the bag. Leave the ice on for 20 minutes, 2-3 times a day. General instructions Apply or take over-the-counter and prescription medicines only as told by your doctor. If you were prescribed an antibiotic medicine, take or apply it as told by your doctor. Do not stop using the antibiotic even if your condition improves. Keep all follow-up visits as told by your doctor. This is important. How is this prevented? To help you have a lower risk of insect bites: When you are outside, wear clothing that covers your arms and legs. Use insect repellent. The best insect repellents contain one of these: DEET. Picaridin. Oil of lemon eucalyptus (OLE). [id number]. Consider spraying your clothing with a pesticide called permethrin. Permethrin helps prevent insect bites. It works for several weeks and for up to 5-6 clothing washes. Do not apply permethrin directly to the skin. If your home windows do not have screens, think about putting some in. If you will be sleeping in an area where there are mosquitoes, consider covering your sleeping area with a mosquito  net. Contact a doctor if: You have redness, swelling, or pain in the bite area. You have fluid or blood coming from the bite area. The bite area feels warm to the touch. You have pus or a bad smell coming from the bite area. You have a fever. Get help right away if: You have joint pain. You have a rash. You  feel more tired or sleepy than you normally do. You have neck pain. You have a headache. You feel weaker than you normally do. You have signs of an anaphylactic reaction. Signs may include: Feeling warm in the face. Itchy, red, swollen areas of skin. Swelling of your: Eyes. Lips. Face. Mouth. Tongue. Throat. Trouble with any of these: Breathing. Talking. Swallowing. Loud breathing. Feeling dizzy or light-headed. Passing out. Pain or cramps in your belly. Throwing up. Watery poop. These symptoms may be an emergency. Do not wait to see if the symptoms will go away. Do this right away: Use your auto-injector pen as you have been told. Get medical help. Call your local emergency services (911 in the [place] Do not drive yourself to the hospital. Summary An insect bite can make your skin red, itchy, and swollen. Treatment is usually not needed. Symptoms often go away on their own. Do not scratch the bite area. Keep it clean and dry. Ice can help with pain and itching from the bite. This information is not intended to replace advice given to you by your health care provider. Make sure you discuss any questions you have with your health care provider. Document Revised: 07/14/2021 Document Reviewed: 07/14/2021 Elsevier Patient Education  [place].

## 2022-05-25 ENCOUNTER — Other Ambulatory Visit: Payer: Self-pay | Admitting: Family Medicine

## 2022-05-25 DIAGNOSIS — E785 Hyperlipidemia, unspecified: Secondary | ICD-10-CM

## 2022-07-06 ENCOUNTER — Encounter: Payer: Self-pay | Admitting: Family Medicine

## 2022-07-06 ENCOUNTER — Ambulatory Visit (INDEPENDENT_AMBULATORY_CARE_PROVIDER_SITE_OTHER): Payer: Medicare Other | Admitting: Family Medicine

## 2022-07-06 VITALS — BP 139/77 | HR 85 | Temp 97.5°F | Ht 64.0 in | Wt 209.6 lb

## 2022-07-06 DIAGNOSIS — R7303 Prediabetes: Secondary | ICD-10-CM

## 2022-07-06 DIAGNOSIS — E785 Hyperlipidemia, unspecified: Secondary | ICD-10-CM

## 2022-07-06 DIAGNOSIS — R2242 Localized swelling, mass and lump, left lower limb: Secondary | ICD-10-CM | POA: Diagnosis not present

## 2022-07-06 LAB — BAYER DCA HB A1C WAIVED: HB A1C (BAYER DCA - WAIVED): 6 % — ABNORMAL HIGH (ref 4.8–5.6)

## 2022-07-06 MED ORDER — ATORVASTATIN CALCIUM 40 MG PO TABS: 40.0000 mg | ORAL_TABLET | Freq: Every day | ORAL | 3 refills | Status: DC

## 2022-07-06 NOTE — Progress Notes (Signed)
Subjective: CC:HTN, HLD, Pre-DM PCP: Janora Norlander, DO TKZ:SWFUXN Joyce Jenkins is a 71 y.o. female presenting to clinic today for:  1. HTN, HLD, Pre-DM associated with obesity Compliant with atorvastatin.  No refills needed at this time.  No chest pain, shortness breath, dizziness.  She was sick with what she thinks was COVID-19 recently and still has a little bit of fatigue off of that but otherwise has been feeling her normal self.  2.  Skin lesion Patient reports a skin lesion on the left lower ankle.  This started out as a vesicle but seem to harden over time.  She denies any skin color changes, spontaneous bleeding or scabbing over   ROS: Per HPI  Allergies  Allergen Reactions   Salac [Salicylic Acid] Swelling   Aspirin Swelling   Ibuprofen Other (See Comments)    Pt. Was told not to take Ibuprofen, based on her anaphylactic reaction to ASA   Past Medical History:  Diagnosis Date   Allergy    Colon polyps    Diverticulosis    GERD (gastroesophageal reflux disease)    Hyperlipidemia    Obesity     Current Outpatient Medications:    atorvastatin (LIPITOR) 40 MG tablet, TAKE 1 TABLET DAILY, Disp: 90 tablet, Rfl: 0   calcium carbonate (OSCAL) 1500 (600 Ca) MG TABS tablet, Take 600 mg by mouth daily., Disp: , Rfl:    cetirizine (ZYRTEC) 10 MG tablet, Take 10 mg by mouth as needed. , Disp: , Rfl:    Coenzyme Q10 (CO Q-10) 200 MG CAPS, Take 1 capsule by mouth daily., Disp: , Rfl:    famotidine (PEPCID) 20 MG tablet, Take by mouth., Disp: , Rfl:    Multiple Vitamin (MULTIVITAMIN) tablet, Take 1 tablet by mouth daily., Disp: , Rfl:  Social History   Socioeconomic History   Marital status: Married    Spouse name: Merrilee Seashore   Number of children: 2   Years of education: Not on file   Highest education level: Not on file  Occupational History   Occupation: Retired Pharmacist, hospital  Tobacco Use   Smoking status: Never   Smokeless tobacco: Never  Vaping Use   Vaping Use: Never used   Substance and Sexual Activity   Alcohol use: Yes    Alcohol/week: 2.0 standard drinks of alcohol    Types: 2 Glasses of wine per week   Drug use: No   Sexual activity: Not on file  Other Topics Concern   Not on file  Social History Narrative   Not on file   Social Determinants of Health   Financial Resource Strain: Low Risk  (04/22/2022)   Overall Financial Resource Strain (CARDIA)    Difficulty of Paying Living Expenses: Not hard at all  Food Insecurity: No Food Insecurity (04/22/2022)   Hunger Vital Sign    Worried About Running Out of Food in the Last Year: Never true    Ran Out of Food in the Last Year: Never true  Transportation Needs: No Transportation Needs (04/22/2022)   PRAPARE - Hydrologist (Medical): No    Lack of Transportation (Non-Medical): No  Physical Activity: Insufficiently Active (04/22/2022)   Exercise Vital Sign    Days of Exercise per Week: 2 days    Minutes of Exercise per Session: 50 min  Stress: No Stress Concern Present (04/22/2022)   [place]    Feeling of Stress : Not at all  Social Connections: Socially Integrated (04/22/2022)   Social Connection and Isolation Panel [NHANES]    Frequency of Communication with Friends and Family: More than three times a week    Frequency of Social Gatherings with Friends and Family: More than three times a week    Attends Religious Services: More than 4 times per year    Active Member of [employer] or Organizations: Yes    Attends Music therapist: More than 4 times per year    Marital Status: Married  Human resources officer Violence: Not At Risk (04/22/2022)   Humiliation, Afraid, Rape, and Kick questionnaire    Fear of Current or Ex-Partner: No    Emotionally Abused: No    Physically Abused: No    Sexually Abused: No   Family History  Problem Relation Age of Onset   Colon polyps Mother    Atrial fibrillation  Mother    Diabetes Paternal Grandmother    Cancer Father        lung and kidney   Heart disease Father    Colon cancer Neg Hx    Esophageal cancer Neg Hx    Liver cancer Neg Hx    Pancreatic cancer Neg Hx    Rectal cancer Neg Hx    Stomach cancer Neg Hx     Objective: Office vital signs reviewed. BP 139/77   Pulse 85   Temp (!) 97.5 F (36.4 C) (Temporal)   Ht '5\' 4"'  (1.626 m)   Wt 209 lb 9.6 oz (95.1 kg)   SpO2 95%   BMI 35.98 kg/m   Physical Examination:  General: Awake, alert, well nourished, No acute distress HEENT: sclera white, MMM Cardio: regular rate and rhythm, S1S2 heard, no murmurs appreciated Pulm: clear to auscultation bilaterally, no wheezes, rhonchi or rales; normal work of breathing on room air Extremities: warm, well perfused, No edema, cyanosis or clubbing; +2 pulses bilaterally MSK: normal gait and station Skin: nodular lesion noted along the lateral left lower ankle  Assessment/ Plan: 71 y.o. female   Dyslipidemia - Plan: Lipid Panel, CMP14+EGFR, atorvastatin (LIPITOR) 40 MG tablet  Pre-diabetes - Plan: Bayer DCA Hb A1c Waived  Morbid obesity ([place]) - Plan: Bayer DCA Hb A1c Waived, Lipid Panel, CMP14+EGFR  Nodule of skin of left lower extremity  Fasting labs have been collected.  3 pound weight loss since her last visit but I think this may be due to acute illness with suspected COVID-19 a few weeks ago.  Lifestyle modification is recommended however.  Lesion of concern seems to be nodular in nature.  Offered referral for excision but because its not bothering her and does not have any concerning features she would like to hold off on that  Orders Placed This Encounter  Procedures   Bayer DCA Hb A1c Waived   Lipid Panel   CMP14+EGFR   No orders of the defined types were placed in this encounter.    Janora Norlander, DO [place] [phone]

## 2022-07-07 LAB — LIPID PANEL
Chol/HDL Ratio: 3.1 ratio (ref 0.0–4.4)
Cholesterol, Total: 129 mg/dL (ref 100–199)
HDL: 42 mg/dL (ref >39)
LDL Chol Calc (NIH): 56 mg/dL (ref 0–99)
Triglycerides: 187 mg/dL — ABNORMAL HIGH (ref 0–149)
VLDL Cholesterol Cal: 31 mg/dL (ref 5–40)

## 2022-07-07 LAB — CMP14+EGFR
ALT: 29 IU/L (ref 0–32)
AST: 26 IU/L (ref 0–40)
Albumin/Globulin Ratio: 1.7 (ref 1.2–2.2)
Albumin: 4 g/dL (ref 3.8–4.8)
Alkaline Phosphatase: 111 IU/L (ref 44–121)
BUN/Creatinine Ratio: 16 (ref 12–28)
BUN: 12 mg/dL (ref 8–27)
Bilirubin Total: 0.8 mg/dL (ref 0.0–1.2)
CO2: 24 mmol/L (ref 20–29)
Calcium: 9.3 mg/dL (ref 8.7–10.3)
Chloride: 103 mmol/L (ref 96–106)
Creatinine, Ser: 0.76 mg/dL (ref 0.57–1.00)
Globulin, Total: 2.3 g/dL (ref 1.5–4.5)
Glucose: 134 mg/dL — ABNORMAL HIGH (ref 70–99)
Potassium: 4.8 mmol/L (ref 3.5–5.2)
Sodium: 142 mmol/L (ref 134–144)
Total Protein: 6.3 g/dL (ref 6.0–8.5)
eGFR: 83 mL/min/{1.73_m2} (ref >59)

## 2022-10-12 ENCOUNTER — Ambulatory Visit (INDEPENDENT_AMBULATORY_CARE_PROVIDER_SITE_OTHER): Payer: Medicare Other | Admitting: Family Medicine

## 2022-10-12 ENCOUNTER — Encounter: Payer: Self-pay | Admitting: Family Medicine

## 2022-10-12 VITALS — BP 131/72 | HR 100 | Temp 100.3°F | Ht 64.0 in | Wt 203.4 lb

## 2022-10-12 DIAGNOSIS — R509 Fever, unspecified: Secondary | ICD-10-CM | POA: Diagnosis not present

## 2022-10-12 DIAGNOSIS — J101 Influenza due to other identified influenza virus with other respiratory manifestations: Secondary | ICD-10-CM

## 2022-10-12 LAB — VERITOR FLU A/B WAIVED
Influenza A: POSITIVE — AB
Influenza B: NEGATIVE

## 2022-10-12 MED ORDER — OSELTAMIVIR PHOSPHATE 75 MG PO CAPS: 75.0000 mg | ORAL_CAPSULE | Freq: Two times a day (BID) | ORAL | 0 refills | Status: AC

## 2022-10-12 NOTE — Progress Notes (Signed)
Acute Office Visit  Subjective:     Patient ID: Joyce Jenkins, female    DOB: [birth year], 71 y.o.   MRN: [id number]  Chief Complaint  Patient presents with   Fever    Fever  This is a new problem. The current episode started yesterday. The maximum temperature noted was 103 to 103.9 F. The temperature was taken using a tympanic thermometer. Associated symptoms include congestion, coughing, ear pain, headaches and a sore throat. Pertinent negatives include no abdominal pain, chest pain, diarrhea, muscle aches, nausea, vomiting or wheezing. She has tried acetaminophen (mucinex) for the symptoms. The treatment provided mild relief.    Review of Systems  Constitutional:  Positive for fever.  HENT:  Positive for congestion, ear pain and sore throat.   Respiratory:  Positive for cough. Negative for wheezing.   Cardiovascular:  Negative for chest pain.  Gastrointestinal:  Negative for abdominal pain, diarrhea, nausea and vomiting.  Neurological:  Positive for headaches.        Objective:    BP 131/72   Pulse 100   Temp 100.3 F (37.9 C) (Oral)   Ht '5\' 4"'$  (1.626 m)   Wt 203 lb 6 oz (92.3 kg)   SpO2 92%   BMI 34.91 kg/m    Physical Exam Vitals and nursing note reviewed.  Constitutional:      General: She is not in acute distress.    Appearance: She is ill-appearing. She is not toxic-appearing or diaphoretic.  HENT:     Head: Normocephalic and atraumatic.     Right Ear: Tympanic membrane, ear canal and external ear normal.     Left Ear: Tympanic membrane, ear canal and external ear normal.     Nose: Congestion present.     Mouth/Throat:     Mouth: Mucous membranes are moist.     Pharynx: Oropharynx is clear. No pharyngeal swelling, oropharyngeal exudate or posterior oropharyngeal erythema.     Tonsils: No tonsillar exudate or tonsillar abscesses.  Eyes:     General: No scleral icterus.       Right eye: No discharge.        Left eye: No discharge.      Conjunctiva/sclera: Conjunctivae normal.  Cardiovascular:     Rate and Rhythm: Normal rate and regular rhythm.     Heart sounds: Normal heart sounds. No murmur heard. Pulmonary:     Effort: Pulmonary effort is normal.     Breath sounds: Normal breath sounds.  Musculoskeletal:     Cervical back: No rigidity.  Lymphadenopathy:     Cervical: No cervical adenopathy.  Neurological:     General: No focal deficit present.     Mental Status: She is alert and oriented to person, place, and time.  Psychiatric:        Mood and Affect: Mood normal.        Behavior: Behavior normal.     No results found for any visits on 10/12/22.      Assessment & Plan:   Joyce Jenkins was seen today for fever.  Diagnoses and all orders for this visit:  Fever in adult Positive flu A today.  -     Veritor Flu A/B Waived  Influenza A Tamiflu discussed and ordered. Discussed symptomatic care and return precautions.  -     oseltamivir (TAMIFLU) 75 MG capsule; Take 1 capsule (75 mg total) by mouth 2 (two) times daily for 5 days.  The patient indicates understanding of these issues and agrees with  the plan.  Gwenlyn Perking, FNP

## 2023-02-17 ENCOUNTER — Other Ambulatory Visit: Payer: Self-pay | Admitting: Family Medicine

## 2023-02-17 DIAGNOSIS — Z1231 Encounter for screening mammogram for malignant neoplasm of breast: Secondary | ICD-10-CM

## 2023-03-29 ENCOUNTER — Telehealth: Payer: Medicare Other | Admitting: Physician Assistant

## 2023-03-29 DIAGNOSIS — H9201 Otalgia, right ear: Secondary | ICD-10-CM

## 2023-03-29 NOTE — Progress Notes (Signed)
Message sent to patient requesting further input regarding current symptoms. Awaiting patient response.  

## 2023-03-29 NOTE — Progress Notes (Signed)
Because of numbness and radiation down the face and need for further evaluation, I feel your condition warrants further evaluation and I recommend that you be seen in a face to face visit.   NOTE: There will be NO CHARGE for this eVisit   If you are having a true medical emergency please call 911.      For an urgent face to face visit, [facility] has eight urgent care centers for your convenience:   NEW!! [facility] Urgent Care Center at [facility] Get Driving Directions [phone] [address], [address] [place], [address]    [facility] Health Urgent Care Center at [facility] Get Driving Directions [phone] [address] [place], [place] [address]   [facility] Health Urgent Care Center [facility]) Get Driving Directions [phone] [address] [place], [place] [address]  [facility] Health Urgent Care Center [facility]) Get Driving Directions [phone] [address] [place],  [place]  [address]  [facility] Health Urgent Care Center [facility] - at [employer]  [phone] [address] [place],  [place] [address]   [facility] Health Urgent Care at [facility] Get Driving Directions [phone] [address] [place], [place] [address]   [facility] Health Urgent Care at [facility] Get Driving Directions  [phone] [address] [place], [place] [address]   [facility] Health Urgent Care at [facility] Directions [phone] [address], [address] [place], [place] [address]  Your MyChart E-visit questionnaire answers were reviewed by a board certified advanced clinical practitioner to complete your personal care plan based on your specific symptoms.  Thank you for using e-Visits.

## 2023-03-30 ENCOUNTER — Ambulatory Visit: Payer: Medicare Other | Admitting: Nurse Practitioner

## 2023-04-05 ENCOUNTER — Ambulatory Visit
Admission: RE | Admit: 2023-04-05 | Discharge: 2023-04-05 | Disposition: A | Discharge status: Home / Self Care | Payer: Medicare Other | Source: Ambulatory Visit | Attending: Family Medicine | Admitting: Family Medicine

## 2023-04-05 DIAGNOSIS — Z1231 Encounter for screening mammogram for malignant neoplasm of breast: Secondary | ICD-10-CM

## 2023-04-06 ENCOUNTER — Encounter: Payer: Self-pay | Admitting: Nurse Practitioner

## 2023-04-06 ENCOUNTER — Ambulatory Visit (INDEPENDENT_AMBULATORY_CARE_PROVIDER_SITE_OTHER): Payer: Medicare Other | Admitting: Nurse Practitioner

## 2023-04-06 VITALS — BP 132/72 | HR 63 | Temp 97.7°F | Ht 64.0 in | Wt 206.0 lb

## 2023-04-06 DIAGNOSIS — J302 Other seasonal allergic rhinitis: Secondary | ICD-10-CM | POA: Diagnosis not present

## 2023-04-06 DIAGNOSIS — H66001 Acute suppurative otitis media without spontaneous rupture of ear drum, right ear: Secondary | ICD-10-CM | POA: Insufficient documentation

## 2023-04-06 MED ORDER — AMOXICILLIN 875 MG PO TABS: 875.0000 mg | ORAL_TABLET | Freq: Two times a day (BID) | ORAL | 0 refills | Status: AC

## 2023-04-06 MED ORDER — FLUTICASONE PROPIONATE 50 MCG/ACT NA SUSP: 2.0000 | Freq: Every day | NASAL | 6 refills | Status: DC

## 2023-04-06 NOTE — Progress Notes (Signed)
Acute Office Visit  Subjective:     Patient ID: Joyce Jenkins, female    DOB: 1951/04/07, 72 y.o.   MRN: [id number]  Chief Complaint  Patient presents with   Ear Pain    Right ear pain been going on for a week.. Has used OTC ear drops but has not helped, has also been taking tylenol     HPI Joyce Jenkins is a 72 y.o. female complains of pain in right ear for 13 days. No fever, had been using OTC otitic solution with no relief. Describes the pain as dull.  Right ear reveals tenderness of the tragus; debris and inflammation in external canal. Reports past history of allergic rhinitis and has taking OTC Zyrtec and has been dealing with rhinorrhea for 31-months that is not relief with her current medication.Denies cough, SOB, chest pain, N/V, dizziness.   ROS Negative unless indicated in HPI    Objective:    BP 132/72   Pulse 63   Temp 97.7 F (36.5 C) (Temporal)   Ht 5\' 4"  (1.626 m)   Wt 206 lb (93.4 kg)   SpO2 100%   BMI 35.36 kg/m  BP Readings from Last 3 Encounters:  04/06/23 132/72  10/12/22 131/72  07/06/22 139/77   Wt Readings from Last 3 Encounters:  04/06/23 206 lb (93.4 kg)  10/12/22 203 lb 6 oz (92.3 kg)  07/06/22 209 lb 9.6 oz (95.1 kg)      Physical Exam Vitals and nursing note reviewed.  Constitutional:      Appearance: Normal appearance. She is overweight.  HENT:     Head: Normocephalic and atraumatic.     Right Ear: Hearing normal. Tenderness present. Tympanic membrane is erythematous.     Left Ear: Hearing and external ear normal.     Nose: Rhinorrhea present.     Right Sinus: No maxillary sinus tenderness or frontal sinus tenderness.     Left Sinus: No maxillary sinus tenderness or frontal sinus tenderness.  Eyes:     Pupils: Pupils are equal, round, and reactive to light.  Cardiovascular:     Rate and Rhythm: Normal rate.     Heart sounds: Normal heart sounds.  Pulmonary:     Effort: Pulmonary effort is normal.     Breath sounds: Normal  breath sounds.  Skin:    General: Skin is warm and dry.     Capillary Refill: Capillary refill takes less than 2 seconds.     Findings: No rash.  Neurological:     General: No focal deficit present.     Mental Status: She is alert and oriented to person, place, and time.  Psychiatric:        Mood and Affect: Mood normal.        Thought Content: Thought content normal.        Judgment: Judgment normal.     No results found for any visits on 04/06/23.      Assessment & Plan:  Non-recurrent acute suppurative otitis media of right ear without spontaneous rupture of tympanic membrane -     Amoxicillin; Take 1 tablet (875 mg total) by mouth 2 (two) times daily for 7 days.  Dispense: 14 tablet; Refill: 0  Seasonal allergic rhinitis, unspecified trigger -     Fluticasone Propionate; Place 2 sprays into both nostrils daily.  Dispense: 16 g; Refill: 6   Otitis media Star Amoxicillin 875 mg 1-tab BID for 7-days Allergic Rhinitis Continue Zyrtec 10 mg 1-tab daily Star  Fluticasone 2 spay in both nostrils daily  Instructed to keep ear dry until better  call if persistent pain, swelling or fever  Return if symptoms worsen or fail to improve.  [address] Santa Lighter DNP

## 2023-04-15 ENCOUNTER — Other Ambulatory Visit: Payer: Self-pay | Admitting: *Deleted

## 2023-04-15 ENCOUNTER — Encounter: Payer: Self-pay | Admitting: Family Medicine

## 2023-04-15 MED ORDER — AMOXICILLIN-POT CLAVULANATE 875-125 MG PO TABS: 1.0000 | ORAL_TABLET | Freq: Two times a day (BID) | ORAL | 0 refills | Status: DC

## 2023-04-22 MED ORDER — AMOXICILLIN-POT CLAVULANATE 875-125 MG PO TABS: 1.0000 | ORAL_TABLET | Freq: Two times a day (BID) | ORAL | 0 refills | Status: DC

## 2023-04-26 ENCOUNTER — Ambulatory Visit (INDEPENDENT_AMBULATORY_CARE_PROVIDER_SITE_OTHER): Payer: Medicare Other

## 2023-04-26 VITALS — Ht 64.0 in | Wt 202.0 lb

## 2023-04-26 DIAGNOSIS — Z1211 Encounter for screening for malignant neoplasm of colon: Secondary | ICD-10-CM

## 2023-04-26 DIAGNOSIS — Z0000 Encounter for general adult medical examination without abnormal findings: Secondary | ICD-10-CM

## 2023-04-26 NOTE — Patient Instructions (Signed)
Joyce Jenkins , Thank you for taking time to come for your Medicare Wellness Visit. I appreciate your ongoing commitment to your health goals. Please review the following plan we discussed and let me know if I can assist you in the future.   These are the goals we discussed:  Goals      Exercise 150 min/wk Moderate Activity     Patient Stated     04/22/2022 AWV Goal: Fall Prevention  Over the next year, patient will decrease their risk for falls by: Using assistive devices, such as a cane or walker, as needed Identifying fall risks within their home and correcting them by: Removing throw rugs Adding handrails to stairs or ramps Removing clutter and keeping a clear pathway throughout the home Increasing light, especially at night Adding shower handles/bars Raising toilet seat Identifying potential personal risk factors for falls: Medication side effects Incontinence/urgency Vestibular dysfunction Hearing loss Musculoskeletal disorders Neurological disorders Orthostatic hypotension          This is a list of the screening recommended for you and due dates:  Health Maintenance  Topic Date Due   Zoster (Shingles) Vaccine (1 of 2) Never done   Colon Cancer Screening  02/17/2023   DTaP/Tdap/Td vaccine (2 - Td or Tdap) 04/05/2023   Flu Shot  05/27/2023   DEXA scan (bone density measurement)  10/17/2023   Mammogram  04/04/2024   Medicare Annual Wellness Visit  04/25/2024   Pneumonia Vaccine  Completed   Hepatitis C Screening  Completed   HPV Vaccine  Aged Out   COVID-19 Vaccine  Discontinued    Advanced directives: Advance directive discussed with you today. I have provided a copy for you to complete at home and have notarized. Once this is complete please bring a copy in to our office so we can scan it into your chart. Information on Advanced Care Planning can be found at [facility] Advance Health Care Directives Advance Health Care Directives ([email or web address]     Conditions/risks identified: Aim for 30 minutes of exercise or brisk walking, 6-8 glasses of water, and 5 servings of fruits and vegetables each day.   Next appointment: Follow up in one year for your annual wellness visit    Preventive Care 65 Years and Older, Female Preventive care refers to lifestyle choices and visits with your health care provider that can promote health and wellness. What does preventive care include? A yearly physical exam. This is also called an annual well check. Dental exams once or twice a year. Routine eye exams. Ask your health care provider how often you should have your eyes checked. Personal lifestyle choices, including: Daily care of your teeth and gums. Regular physical activity. Eating a healthy diet. Avoiding tobacco and drug use. Limiting alcohol use. Practicing safe sex. Taking low-dose aspirin every day. Taking vitamin and mineral supplements as recommended by your health care provider. What happens during an annual well check? The services and screenings done by your health care provider during your annual well check will depend on your age, overall health, lifestyle risk factors, and family history of disease. Counseling  Your health care provider may ask you questions about your: Alcohol use. Tobacco use. Drug use. Emotional well-being. Home and relationship well-being. Sexual activity. Eating habits. History of falls. Memory and ability to understand (cognition). Work and work Astronomer. Reproductive health. Screening  You may have the following tests or measurements: Height, weight, and BMI. Blood pressure. Lipid and cholesterol levels. These may  be checked every 5 years, or more frequently if you are over 56 years old. Skin check. Lung cancer screening. You may have this screening every year starting at age 66 if you have a 30-pack-year history of smoking and currently smoke or have quit within the past 15 years. Fecal  occult blood test (FOBT) of the stool. You may have this test every year starting at age 58. Flexible sigmoidoscopy or colonoscopy. You may have a sigmoidoscopy every 5 years or a colonoscopy every 10 years starting at age 25. Hepatitis C blood test. Hepatitis B blood test. Sexually transmitted disease (STD) testing. Diabetes screening. This is done by checking your blood sugar (glucose) after you have not eaten for a while (fasting). You may have this done every 1-3 years. Bone density scan. This is done to screen for osteoporosis. You may have this done starting at age 28. Mammogram. This may be done every 1-2 years. Talk to your health care provider about how often you should have regular mammograms. Talk with your health care provider about your test results, treatment options, and if necessary, the need for more tests. Vaccines  Your health care provider may recommend certain vaccines, such as: Influenza vaccine. This is recommended every year. Tetanus, diphtheria, and acellular pertussis (Tdap, Td) vaccine. You may need a Td booster every 10 years. Zoster vaccine. You may need this after age 35. Pneumococcal 13-valent conjugate (PCV13) vaccine. One dose is recommended after age 100. Pneumococcal polysaccharide (PPSV23) vaccine. One dose is recommended after age 25. Talk to your health care provider about which screenings and vaccines you need and how often you need them. This information is not intended to replace advice given to you by your health care provider. Make sure you discuss any questions you have with your health care provider. Document Released: 11/08/2015 Document Revised: 07/01/2016 Document Reviewed: 08/13/2015 Elsevier Interactive Patient Education  2017 [employer].  Fall Prevention in the Home Falls can cause injuries. They can happen to people of all ages. There are many things you can do to make your home safe and to help prevent falls. What can I do on the outside  of my home? Regularly fix the edges of walkways and driveways and fix any cracks. Remove anything that might make you trip as you walk through a door, such as a raised step or threshold. Trim any bushes or trees on the path to your home. Use bright outdoor lighting. Clear any walking paths of anything that might make someone trip, such as rocks or tools. Regularly check to see if handrails are loose or broken. Make sure that both sides of any steps have handrails. Any raised decks and porches should have guardrails on the edges. Have any leaves, snow, or ice cleared regularly. Use sand or salt on walking paths during winter. Clean up any spills in your garage right away. This includes oil or grease spills. What can I do in the bathroom? Use night lights. Install grab bars by the toilet and in the tub and shower. Do not use towel bars as grab bars. Use non-skid mats or decals in the tub or shower. If you need to sit down in the shower, use a plastic, non-slip stool. Keep the floor dry. Clean up any water that spills on the floor as soon as it happens. Remove soap buildup in the tub or shower regularly. Attach bath mats securely with double-sided non-slip rug tape. Do not have throw rugs and other things on the floor  that can make you trip. What can I do in the bedroom? Use night lights. Make sure that you have a light by your bed that is easy to reach. Do not use any sheets or blankets that are too big for your bed. They should not hang down onto the floor. Have a firm chair that has side arms. You can use this for support while you get dressed. Do not have throw rugs and other things on the floor that can make you trip. What can I do in the kitchen? Clean up any spills right away. Avoid walking on wet floors. Keep items that you use a lot in easy-to-reach places. If you need to reach something above you, use a strong step stool that has a grab bar. Keep electrical cords out of the  way. Do not use floor polish or wax that makes floors slippery. If you must use wax, use non-skid floor wax. Do not have throw rugs and other things on the floor that can make you trip. What can I do with my stairs? Do not leave any items on the stairs. Make sure that there are handrails on both sides of the stairs and use them. Fix handrails that are broken or loose. Make sure that handrails are as long as the stairways. Check any carpeting to make sure that it is firmly attached to the stairs. Fix any carpet that is loose or worn. Avoid having throw rugs at the top or bottom of the stairs. If you do have throw rugs, attach them to the floor with carpet tape. Make sure that you have a light switch at the top of the stairs and the bottom of the stairs. If you do not have them, ask someone to add them for you. What else can I do to help prevent falls? Wear shoes that: Do not have high heels. Have rubber bottoms. Are comfortable and fit you well. Are closed at the toe. Do not wear sandals. If you use a stepladder: Make sure that it is fully opened. Do not climb a closed stepladder. Make sure that both sides of the stepladder are locked into place. Ask someone to hold it for you, if possible. Clearly mark and make sure that you can see: Any grab bars or handrails. First and last steps. Where the edge of each step is. Use tools that help you move around (mobility aids) if they are needed. These include: Canes. Walkers. Scooters. Crutches. Turn on the lights when you go into a dark area. Replace any light bulbs as soon as they burn out. Set up your furniture so you have a clear path. Avoid moving your furniture around. If any of your floors are uneven, fix them. If there are any pets around you, be aware of where they are. Review your medicines with your doctor. Some medicines can make you feel dizzy. This can increase your chance of falling. Ask your doctor what other things that you can  do to help prevent falls. This information is not intended to replace advice given to you by your health care provider. Make sure you discuss any questions you have with your health care provider. Document Released: 08/08/2009 Document Revised: 03/19/2016 Document Reviewed: 11/16/2014 Elsevier Interactive Patient Education  2017 [employer].

## 2023-04-26 NOTE — Progress Notes (Signed)
Subjective:   Joyce Jenkins is a 72 y.o. female who presents for Medicare Annual (Subsequent) preventive examination.  Visit Complete: Virtual  I connected with  Joyce Jenkins on 04/26/23 by a audio enabled telemedicine application and verified that I am speaking with the correct person using two identifiers.  Patient Location: Home  Provider Location: Home Office  I discussed the limitations of evaluation and management by telemedicine. The patient expressed understanding and agreed to proceed.  Patient Medicare AWV questionnaire was completed by the patient on 04/26/2023; I have confirmed that all information answered by patient is correct and no changes since this date.  Review of Systems           Objective:    Today's Vitals   04/26/23 1116  Weight: 202 lb (91.6 kg)  Height: 5\' 4"  (1.626 m)   Body mass index is 34.67 kg/m.     04/26/2023   11:20 AM 04/22/2022    3:10 PM 04/14/2021   11:19 AM 03/05/2020    9:49 AM 02/16/2018   10:19 AM  Advanced Directives  Does Patient Have a Medical Advance Directive? No No No No No  Would patient like information on creating a medical advance directive? No - Patient declined No - Patient declined No - Patient declined No - Patient declined No - Patient declined    Current Medications (verified) Outpatient Encounter Medications as of 04/26/2023  Medication Sig   atorvastatin (LIPITOR) 40 MG tablet Take 1 tablet (40 mg total) by mouth daily.   cetirizine (ZYRTEC) 10 MG tablet Take 10 mg by mouth as needed.    Coenzyme Q10 (CO Q-10) 200 MG CAPS Take 1 capsule by mouth daily.   famotidine (PEPCID) 20 MG tablet Take by mouth.   fluticasone (FLONASE) 50 MCG/ACT nasal spray Place 2 sprays into both nostrils daily.   Multiple Vitamin (MULTIVITAMIN) tablet Take 1 tablet by mouth daily.   amoxicillin-clavulanate (AUGMENTIN) 875-125 MG tablet Take 1 tablet by mouth 2 (two) times daily. Take all of this medication (Patient not taking:  Reported on 04/26/2023)   calcium carbonate (OSCAL) 1500 (600 Ca) MG TABS tablet Take 600 mg by mouth daily.   No facility-administered encounter medications on file as of 04/26/2023.    Allergies (verified) Salac [salicylic acid], Aspirin, and Ibuprofen   History: Past Medical History:  Diagnosis Date   Allergy    Colon polyps    Diverticulosis    GERD (gastroesophageal reflux disease)    Hyperlipidemia    Obesity    Past Surgical History:  Procedure Laterality Date   CHOLECYSTECTOMY     THYROID CYST EXCISION     Family History  Problem Relation Age of Onset   Breast cancer Mother    Colon polyps Mother    Atrial fibrillation Mother    Cancer Father        lung and kidney   Heart disease Father    Diabetes Paternal Grandmother    Colon cancer Neg Hx    Esophageal cancer Neg Hx    Liver cancer Neg Hx    Pancreatic cancer Neg Hx    Rectal cancer Neg Hx    Stomach cancer Neg Hx    Social History   Socioeconomic History   Marital status: Married    Spouse name: Weston Brass   Number of children: 2   Years of education: Not on file   Highest education level: Not on file  Occupational History   Occupation: Retired Runner, broadcasting/film/video  Tobacco Use   Smoking status: Never   Smokeless tobacco: Never  Vaping Use   Vaping Use: Never used  Substance and Sexual Activity   Alcohol use: Yes    Alcohol/week: 2.0 standard drinks of alcohol    Types: 2 Glasses of wine per week   Drug use: No   Sexual activity: Not on file  Other Topics Concern   Not on file  Social History Narrative   Not on file   Social Determinants of Health   Financial Resource Strain: Low Risk  (04/26/2023)   Overall Financial Resource Strain (CARDIA)    Difficulty of Paying Living Expenses: Not hard at all  Food Insecurity: No Food Insecurity (04/26/2023)   Hunger Vital Sign    Worried About Running Out of Food in the Last Year: Never true    Ran Out of Food in the Last Year: Never true  Transportation Needs: No  Transportation Needs (04/26/2023)   PRAPARE - Administrator, Civil Service (Medical): No    Lack of Transportation (Non-Medical): No  Physical Activity: Insufficiently Active (04/26/2023)   Exercise Vital Sign    Days of Exercise per Week: 2 days    Minutes of Exercise per Session: 40 min  Stress: No Stress Concern Present (04/26/2023)   [employer] of Occupational Health - Occupational Stress Questionnaire    Feeling of Stress : Not at all  Social Connections: Socially Integrated (04/26/2023)   Social Connection and Isolation Panel [NHANES]    Frequency of Communication with Friends and Family: More than three times a week    Frequency of Social Gatherings with Friends and Family: More than three times a week    Attends Religious Services: More than 4 times per year    Active Member of [employer] or Organizations: Yes    Attends Engineer, structural: More than 4 times per year    Marital Status: Married    Tobacco Counseling Counseling given: Not Answered   Clinical Intake:  Pre-visit preparation completed: Yes  Pain : No/denies pain     Nutritional Risks: None Diabetes: No  How often do you need to have someone help you when you read instructions, pamphlets, or other written materials from your doctor or pharmacy?: 1 - Never  Interpreter Needed?: No  Information entered by :: Renie Ora, LPN   Activities of Daily Living    04/22/2023    9:19 AM  In your present state of health, do you have any difficulty performing the following activities:  Hearing? 0  Vision? 0  Difficulty concentrating or making decisions? 0  Walking or climbing stairs? 0  Dressing or bathing? 0  Doing errands, shopping? 0  Preparing Food and eating ? N  Using the Toilet? N  In the past six months, have you accidently leaked urine? Y  Do you have problems with loss of bowel control? N  Managing your Medications? N  Managing your Finances? N  Housekeeping or managing your  Housekeeping? N    Patient Care Team: Raliegh Ip, DO as PCP - General (Family Medicine)  Indicate any recent Medical Services you may have received from other than [facility] providers in the past year (date may be approximate).     Assessment:   This is a routine wellness examination for Joyce Jenkins.  Hearing/Vision screen Vision Screening - Comments:: Wears rx glasses - up to date with routine eye exams with  Dr.Lee   Dietary issues and exercise activities discussed:  Goals Addressed             This Visit's Progress    Exercise 150 min/wk Moderate Activity   On track      Depression Screen    04/26/2023   11:19 AM 04/06/2023    8:27 AM 10/12/2022    9:18 AM 07/06/2022   10:23 AM 05/01/2022    1:20 PM 04/22/2022    3:09 PM 01/12/2022    9:54 AM  PHQ 2/9 Scores  PHQ - 2 Score 0 0  0 0 0 0  PHQ- 9 Score 0 0  0   0  Exception Documentation   Patient refusal        Fall Risk    04/26/2023   11:17 AM 04/22/2023    9:19 AM 04/06/2023    8:27 AM 10/12/2022    9:18 AM 07/06/2022   10:23 AM  Fall Risk   Falls in the past year? 0 0 0 0 0  Number falls in past yr: 0 0     Injury with Fall? 0 0     Risk for fall due to : No Fall Risks  No Fall Risks    Follow up Falls prevention discussed  Falls evaluation completed      MEDICARE RISK AT HOME:  Medicare Risk at Home - 04/26/23 1117     Any stairs in or around the home? No    If so, are there any without handrails? No    Home free of loose throw rugs in walkways, pet beds, electrical cords, etc? Yes    Adequate lighting in your home to reduce risk of falls? Yes    Life alert? No    Use of a cane, walker or w/c? No    Grab bars in the bathroom? Yes    Shower chair or bench in shower? Yes    Elevated toilet seat or a handicapped toilet? Yes             TIMED UP AND GO:  Was the test performed?  No    Cognitive Function:        04/26/2023   11:20 AM 04/22/2022    3:16 PM 04/14/2021   11:22 AM 03/05/2020     9:51 AM  6CIT Screen  What Year? 0 points 0 points 0 points 0 points  What month? 0 points 0 points 0 points 0 points  What time? 0 points 0 points 0 points 0 points  Count back from 20 0 points 0 points 0 points 0 points  Months in reverse 0 points 0 points 0 points 0 points  Repeat phrase 0 points 0 points 0 points 0 points  Total Score 0 points 0 points 0 points 0 points    Immunizations Immunization History  Administered Date(s) Administered   Pneumococcal Conjugate-13 09/24/2016   Pneumococcal Polysaccharide-23 07/15/2015   Tdap 04/04/2013    TDAP status: Due, Education has been provided regarding the importance of this vaccine. Advised may receive this vaccine at local pharmacy or Health Dept. Aware to provide a copy of the vaccination record if obtained from local pharmacy or Health Dept. Verbalized acceptance and understanding.  Flu Vaccine status: Declined, Education has been provided regarding the importance of this vaccine but patient still declined. Advised may receive this vaccine at local pharmacy or Health Dept. Aware to provide a copy of the vaccination record if obtained from local pharmacy or Health Dept. Verbalized acceptance and understanding.  Pneumococcal vaccine status:  Up to date  Covid-19 vaccine status: Declined, Education has been provided regarding the importance of this vaccine but patient still declined. Advised may receive this vaccine at local pharmacy or Health Dept.or vaccine clinic. Aware to provide a copy of the vaccination record if obtained from local pharmacy or Health Dept. Verbalized acceptance and understanding.  Qualifies for Shingles Vaccine? Yes   Zostavax completed No   Shingrix Completed?: No.    Education has been provided regarding the importance of this vaccine. Patient has been advised to call insurance company to determine out of pocket expense if they have not yet received this vaccine. Advised may also receive vaccine at local  pharmacy or Health Dept. Verbalized acceptance and understanding.  Screening Tests Health Maintenance  Topic Date Due   Zoster Vaccines- Shingrix (1 of 2) Never done   Colonoscopy  02/17/2023   DTaP/Tdap/Td (2 - Td or Tdap) 04/05/2023   INFLUENZA VACCINE  05/27/2023   DEXA SCAN  10/17/2023   MAMMOGRAM  04/04/2024   Medicare Annual Wellness (AWV)  04/25/2024   Pneumonia Vaccine 38+ Years old  Completed   Hepatitis C Screening  Completed   HPV VACCINES  Aged Out   COVID-19 Vaccine  Discontinued    Health Maintenance  Health Maintenance Due  Topic Date Due   Zoster Vaccines- Shingrix (1 of 2) Never done   Colonoscopy  02/17/2023   DTaP/Tdap/Td (2 - Td or Tdap) 04/05/2023    Colorectal cancer screening: Referral to GI placed 04/26/2023. Pt aware the office will call re: appt.  Mammogram status: Completed 04/05/2023. Repeat every year  Bone Density status: Completed 10/16/2021. Results reflect: Bone density results: OSTEOPOROSIS. Repeat every 2 years.  Lung Cancer Screening: (Low Dose CT Chest recommended if Age 109-80 years, 20 pack-year currently smoking OR have quit w/in 15years.) does not qualify.   Lung Cancer Screening Referral: n/a  Additional Screening:  Hepatitis C Screening: does not qualify; Completed 10/16/2019  Vision Screening: Recommended annual ophthalmology exams for early detection of glaucoma and other disorders of the eye. Is the patient up to date with their annual eye exam?  Yes  Who is the provider or what is the name of the office in which the patient attends annual eye exams? Dr.Lee  If pt is not established with a provider, would they like to be referred to a provider to establish care? No .   Dental Screening: Recommended annual dental exams for proper oral hygiene  Diabetic Foot Exam: Diabetic Foot Exam: Overdue, Pt has been advised about the importance in completing this exam. Pt is scheduled for diabetic foot exam on next office visit  .  Community Resource Referral / Chronic Care Management: CRR required this visit?  No   CCM required this visit?  No     Plan:     I have personally reviewed and noted the following in the patient's chart:   Medical and social history Use of alcohol, tobacco or illicit drugs  Current medications and supplements including opioid prescriptions. Patient is not currently taking opioid prescriptions. Functional ability and status Nutritional status Physical activity Advanced directives List of other physicians Hospitalizations, surgeries, and ER visits in previous 12 months Vitals Screenings to include cognitive, depression, and falls Referrals and appointments  In addition, I have reviewed and discussed with patient certain preventive protocols, quality metrics, and best practice recommendations. A written personalized care plan for preventive services as well as general preventive health recommendations were provided to patient.  Lorrene Reid, LPN   12/03/4130   After Visit Summary: (MyChart) Due to this being a telephonic visit, the after visit summary with patients personalized plan was offered to patient via MyChart   Nurse Notes: Due Tdap Vaccine

## 2023-07-02 ENCOUNTER — Ambulatory Visit (AMBULATORY_SURGERY_CENTER): Payer: Medicare Other | Admitting: *Deleted

## 2023-07-02 VITALS — Ht 64.0 in | Wt 202.0 lb

## 2023-07-02 DIAGNOSIS — Z86010 Personal history of colonic polyps: Secondary | ICD-10-CM

## 2023-07-02 MED ORDER — NA SULFATE-K SULFATE-MG SULF 17.5-3.13-1.6 GM/177ML PO SOLN: 1.0000 | Freq: Once | ORAL | 0 refills | Status: AC

## 2023-07-02 NOTE — Progress Notes (Signed)
Pt's name and DOB verified at the beginning of the pre-visit.  Pt denies any difficulty with ambulating,sitting, laying down or rolling side to side Gave both [facility] main # and MD on call # prior to instructions.  No egg or soy allergy known to patient  No issues known to pt with past sedation with any surgeries or procedures Pt denies having issues being intubated Pt has no issues moving head neck or swallowing No FH of Malignant Hyperthermia Pt is not on diet pills Pt is not on home 02  Pt is not on blood thinners  Pt denies issues with constipation  Pt is not on dialysis Pt denise any abnormal heart rhythms  Pt denies any upcoming cardiac testing Pt encouraged to use to use Singlecare or Goodrx to reduce cost  Patient's chart reviewed by Cathlyn Parsons CNRA prior to pre-visit and patient appropriate for the [facility].  Pre-visit completed and red dot placed by patient's name on their procedure day (on provider's schedule).  . Visit in person Pt states weight is 202 lb Instructed pt why it is important to and  to call if they have any changes in health or new medications. Directed them to the # given and on instructions.   Pt states they will.  Instructions reviewed with pt and pt states understanding. Instructed to review again prior to procedure. Pt states they will.  Instructiongiven to pt and by my chart

## 2023-07-19 ENCOUNTER — Ambulatory Visit (AMBULATORY_SURGERY_CENTER): Payer: Medicare Other | Admitting: Gastroenterology

## 2023-07-19 ENCOUNTER — Encounter: Payer: Self-pay | Admitting: Gastroenterology

## 2023-07-19 VITALS — BP 123/81 | HR 67 | Temp 97.6°F | Resp 15 | Ht 64.0 in | Wt 202.0 lb

## 2023-07-19 DIAGNOSIS — Z86010 Personal history of colonic polyps: Secondary | ICD-10-CM | POA: Diagnosis not present

## 2023-07-19 DIAGNOSIS — Z09 Encounter for follow-up examination after completed treatment for conditions other than malignant neoplasm: Secondary | ICD-10-CM

## 2023-07-19 DIAGNOSIS — D123 Benign neoplasm of transverse colon: Secondary | ICD-10-CM

## 2023-07-19 MED ORDER — SODIUM CHLORIDE 0.9 % IV SOLN: 500.0000 mL | Freq: Once | INTRAVENOUS | Status: DC

## 2023-07-19 NOTE — Patient Instructions (Signed)
Handouts Provided:  Diverticulosis and Polyps  YOU HAD AN ENDOSCOPIC PROCEDURE TODAY AT THE [facility]:   Refer to the procedure report that was given to you for any specific questions about what was found during the examination.  If the procedure report does not answer your questions, please call your gastroenterologist to clarify.  If you requested that your care partner not be given the details of your procedure findings, then the procedure report has been included in a sealed envelope for you to review at your convenience later.  YOU SHOULD EXPECT: Some feelings of bloating in the abdomen. Passage of more gas than usual.  Walking can help get rid of the air that was put into your GI tract during the procedure and reduce the bloating. If you had a lower endoscopy (such as a colonoscopy or flexible sigmoidoscopy) you may notice spotting of blood in your stool or on the toilet paper. If you underwent a bowel prep for your procedure, you may not have a normal bowel movement for a few days.  Please Note:  You might notice some irritation and congestion in your nose or some drainage.  This is from the oxygen used during your procedure.  There is no need for concern and it should clear up in a day or so.  SYMPTOMS TO REPORT IMMEDIATELY:  Following lower endoscopy (colonoscopy or flexible sigmoidoscopy):  Excessive amounts of blood in the stool  Significant tenderness or worsening of abdominal pains  Swelling of the abdomen that is new, acute  Fever of 100F or higher  For urgent or emergent issues, a gastroenterologist can be reached at any hour by calling [phone]. Do not use MyChart messaging for urgent concerns.    DIET:  We do recommend a small meal at first, but then you may proceed to your regular diet.  Drink plenty of fluids but you should avoid alcoholic beverages for 24 hours.  ACTIVITY:  You should plan to take it easy for the rest of today and you should NOT DRIVE  or use heavy machinery until tomorrow (because of the sedation medicines used during the test).    FOLLOW UP: Our staff will call the number listed on your records the next business day following your procedure.  We will call around 7:15- 8:00 am to check on you and address any questions or concerns that you may have regarding the information given to you following your procedure. If we do not reach you, we will leave a message.     If any biopsies were taken you will be contacted by phone or by letter within the next 1-3 weeks.  Please call us at [phone] if you have not heard about the biopsies in 3 weeks.    SIGNATURES/CONFIDENTIALITY: You and/or your care partner have signed paperwork which will be entered into your electronic medical record.  These signatures attest to the fact that that the information above on your After Visit Summary has been reviewed and is understood.  Full responsibility of the confidentiality of this discharge information lies with you and/or your care-partner.

## 2023-07-19 NOTE — Progress Notes (Signed)
[facility] History and Physical   Primary Care Physician:  Raliegh Ip, DO   Reason for Procedure:  History of adenomatous colon polyps  Plan:    Surveillance colonoscopy with possible interventions as needed     HPI: Joyce Jenkins is a very pleasant 72 y.o. female here for surveillance colonoscopy. Denies any nausea, vomiting, abdominal pain, melena or bright red blood per rectum  The risks and benefits as well as alternatives of endoscopic procedure(s) have been discussed and reviewed. All questions answered. The patient agrees to proceed.    Past Medical History:  Diagnosis Date   Allergy    Colon polyps    Diverticulosis    GERD (gastroesophageal reflux disease)    Hyperlipidemia    Obesity     Past Surgical History:  Procedure Laterality Date   CHOLECYSTECTOMY     COLONOSCOPY     THYROID CYST EXCISION      Prior to Admission medications   Medication Sig Start Date End Date Taking? Authorizing Provider  atorvastatin (LIPITOR) 40 MG tablet Take 1 tablet (40 mg total) by mouth daily. 07/06/22  Yes Gottschalk, Kathie Rhodes M, DO  cetirizine (ZYRTEC) 10 MG tablet Take 10 mg by mouth as needed.    Yes [provider]  famotidine (PEPCID) 20 MG tablet Take by mouth.   Yes [provider]  fluticasone (FLONASE) 50 MCG/ACT nasal spray Place 2 sprays into both nostrils daily. 04/06/23  Yes [facility] Lighter, Dois Davenport, NP  Multiple Vitamin (MULTIVITAMIN) tablet Take 1 tablet by mouth daily.   Yes [provider]    Current Outpatient Medications  Medication Sig Dispense Refill   atorvastatin (LIPITOR) 40 MG tablet Take 1 tablet (40 mg total) by mouth daily. 90 tablet 3   cetirizine (ZYRTEC) 10 MG tablet Take 10 mg by mouth as needed.      famotidine (PEPCID) 20 MG tablet Take by mouth.     fluticasone (FLONASE) 50 MCG/ACT nasal spray Place 2 sprays into both nostrils daily. 16 g 6   Multiple Vitamin (MULTIVITAMIN) tablet Take 1  tablet by mouth daily.     Current Facility-Administered Medications  Medication Dose Route Frequency Provider Last Rate Last Admin   0.9 %  sodium chloride infusion  500 mL Intravenous Once Napoleon Form, MD        Allergies as of 07/19/2023 - Review Complete 07/19/2023  Allergen Reaction Noted   Salac [salicylic acid] Swelling 02/16/2018   Aspirin Swelling 02/04/2017   Ibuprofen Other (See Comments) 02/22/2017    Family History  Problem Relation Age of Onset   Breast cancer Mother    Colon polyps Mother    Atrial fibrillation Mother    Cancer Father        lung and kidney   Heart disease Father    Diabetes Paternal Grandmother    Colon cancer Neg Hx    Esophageal cancer Neg Hx    Liver cancer Neg Hx    Pancreatic cancer Neg Hx    Rectal cancer Neg Hx    Stomach cancer Neg Hx     Social History   Socioeconomic History   Marital status: Married    Spouse name: Weston Brass   Number of children: 2   Years of education: Not on file   Highest education level: Not on file  Occupational History   Occupation: Retired Runner, broadcasting/film/video  Tobacco Use   Smoking status: Never   Smokeless tobacco: Never  Vaping Use  Vaping status: Never Used  Substance and Sexual Activity   Alcohol use: Yes    Alcohol/week: 2.0 standard drinks of alcohol    Types: 2 Glasses of wine per week   Drug use: No   Sexual activity: Not on file  Other Topics Concern   Not on file  Social History Narrative   Not on file   Social Determinants of Health   Financial Resource Strain: Low Risk  (04/26/2023)   Overall Financial Resource Strain (CARDIA)    Difficulty of Paying Living Expenses: Not hard at all  Food Insecurity: No Food Insecurity (04/26/2023)   Hunger Vital Sign    Worried About Running Out of Food in the Last Year: Never true    Ran Out of Food in the Last Year: Never true  Transportation Needs: No Transportation Needs (04/26/2023)   PRAPARE - Administrator, Civil Service (Medical):  No    Lack of Transportation (Non-Medical): No  Physical Activity: Insufficiently Active (04/26/2023)   Exercise Vital Sign    Days of Exercise per Week: 2 days    Minutes of Exercise per Session: 40 min  Stress: No Stress Concern Present (04/26/2023)   [employer] of Occupational Health - Occupational Stress Questionnaire    Feeling of Stress : Not at all  Social Connections: Socially Integrated (04/26/2023)   Social Connection and Isolation Panel [NHANES]    Frequency of Communication with Friends and Family: More than three times a week    Frequency of Social Gatherings with Friends and Family: More than three times a week    Attends Religious Services: More than 4 times per year    Active Member of [employer] or Organizations: Yes    Attends Engineer, structural: More than 4 times per year    Marital Status: Married  Catering manager Violence: Not At Risk (04/26/2023)   Humiliation, Afraid, Rape, and Kick questionnaire    Fear of Current or Ex-Partner: No    Emotionally Abused: No    Physically Abused: No    Sexually Abused: No    Review of Systems:  All other review of systems negative except as mentioned in the HPI.  Physical Exam: Vital signs in last 24 hours: BP (!) 153/79   Pulse 75   Temp 97.6 F (36.4 C)   Ht 5\' 4"  (1.626 m)   Wt 202 lb (91.6 kg)   SpO2 100%   BMI 34.67 kg/m  General:   Alert, NAD Lungs:  Clear .   Heart:  Regular rate and rhythm Abdomen:  Soft, nontender and nondistended. Neuro/Psych:  Alert and cooperative. Normal mood and affect. A and O x 3  Reviewed labs, radiology imaging, old records and pertinent past GI work up  Patient is appropriate for planned procedure(s) and anesthesia in an ambulatory setting   K. Scherry Ran , MD [phone]

## 2023-07-19 NOTE — Op Note (Signed)
[facility] Patient Name: Joyce Jenkins Procedure Date: 07/19/2023 10:57 AM MRN: [id number] Endoscopist: Napoleon Form , MD, [id number] Age: 72 Referring MD:  Date of Birth: 03-13-1951 Gender: Female Account #: [id number] Procedure:                Colonoscopy Indications:              High risk colon cancer surveillance: Personal                            history of colonic polyps Medicines:                Monitored Anesthesia Care Procedure:                Pre-Anesthesia Assessment:                           - Prior to the procedure, a History and Physical                            was performed, and patient medications and                            allergies were reviewed. The patient's tolerance of                            previous anesthesia was also reviewed. The risks                            and benefits of the procedure and the sedation                            options and risks were discussed with the patient.                            All questions were answered, and informed consent                            was obtained. Prior Anticoagulants: The patient has                            taken no anticoagulant or antiplatelet agents. ASA                            Grade Assessment: II - A patient with mild systemic                            disease. After reviewing the risks and benefits,                            the patient was deemed in satisfactory condition to                            undergo the procedure.  After obtaining informed consent, the colonoscope                            was passed under direct vision. Throughout the                            procedure, the patient's blood pressure, pulse, and                            oxygen saturations were monitored continuously. The                            Olympus Scope SN: [id number] was introduced through                            the anus and advanced to the  the cecum, identified                            by appendiceal orifice and ileocecal valve. The                            colonoscopy was performed without difficulty. The                            patient tolerated the procedure well. The quality                            of the bowel preparation was good. The ileocecal                            valve, appendiceal orifice, and rectum were                            photographed. Scope In: 11:08:34 AM Scope Out: 11:25:42 AM Scope Withdrawal Time: 0 hours 14 minutes 8 seconds  Total Procedure Duration: 0 hours 17 minutes 8 seconds  Findings:                 The perianal and digital rectal examinations were                            normal.                           A 5 mm polyp was found in the transverse colon. The                            polyp was sessile. The polyp was removed with a                            cold snare. Resection and retrieval were complete.                           Scattered large-mouthed, medium-mouthed and  small-mouthed diverticula were found in the sigmoid                            colon, descending colon, transverse colon and                            ascending colon.                           Non-bleeding external and internal hemorrhoids were                            found during retroflexion. The hemorrhoids were                            medium-sized. Complications:            No immediate complications. Estimated Blood Loss:     Estimated blood loss was minimal. Impression:               - One 5 mm polyp in the transverse colon, removed                            with a cold snare. Resected and retrieved.                           - Moderate diverticulosis in the sigmoid colon, in                            the descending colon, in the transverse colon and                            in the ascending colon.                           - Non-bleeding external and  internal hemorrhoids. Recommendation:           - Patient has a contact number available for                            emergencies. The signs and symptoms of potential                            delayed complications were discussed with the                            patient. Return to normal activities tomorrow.                            Written discharge instructions were provided to the                            patient.                           - Resume previous diet.                           -  Continue present medications.                           - Await pathology results.                           - Repeat colonoscopy in 5-10 years for surveillance                            based on pathology results. Napoleon Form, MD 07/19/2023 11:35:13 AM This report has been signed electronically.

## 2023-07-19 NOTE — Progress Notes (Signed)
Called to room to assist during endoscopic procedure.  Patient ID and intended procedure confirmed with present staff. Received instructions for my participation in the procedure from the performing physician.  

## 2023-07-20 ENCOUNTER — Telehealth: Payer: Self-pay

## 2023-07-20 NOTE — Telephone Encounter (Signed)
  Follow up Call-     07/19/2023    9:57 AM  Call back number  Post procedure Call Back phone  # [phone]  Permission to leave phone message Yes     Patient questions:  Do you have a fever, pain , or abdominal swelling? No. Pain Score  0 *  Have you tolerated food without any problems? Yes.    Have you been able to return to your normal activities? Yes.    Do you have any questions about your discharge instructions: Diet   No. Medications  No. Follow up visit  No.  Do you have questions or concerns about your Care? No.  Actions: * If pain score is 4 or above: No action needed, pain <4.

## 2023-07-21 ENCOUNTER — Encounter: Payer: Self-pay | Admitting: Gastroenterology

## 2023-07-21 LAB — SURGICAL PATHOLOGY

## 2023-07-26 ENCOUNTER — Other Ambulatory Visit: Payer: Self-pay | Admitting: Family Medicine

## 2023-07-26 DIAGNOSIS — E785 Hyperlipidemia, unspecified: Secondary | ICD-10-CM

## 2023-08-18 ENCOUNTER — Other Ambulatory Visit: Payer: Self-pay | Admitting: Family Medicine

## 2023-08-18 DIAGNOSIS — E785 Hyperlipidemia, unspecified: Secondary | ICD-10-CM

## 2023-08-19 ENCOUNTER — Encounter: Payer: Self-pay | Admitting: Family Medicine

## 2023-08-19 ENCOUNTER — Other Ambulatory Visit: Payer: Self-pay | Admitting: Family Medicine

## 2023-08-19 DIAGNOSIS — E785 Hyperlipidemia, unspecified: Secondary | ICD-10-CM

## 2023-08-19 MED ORDER — ATORVASTATIN CALCIUM 40 MG PO TABS: 40.0000 mg | ORAL_TABLET | Freq: Every day | ORAL | 0 refills | Status: DC

## 2023-08-19 NOTE — Telephone Encounter (Signed)
Gottschalk pt NTBS 30-d given 07/26/23

## 2023-08-19 NOTE — Telephone Encounter (Signed)
  Prescription Request  08/19/2023  Is this a "Controlled Substance" medicine? no  Have you seen your PCP in the last 2 weeks? no  If YES, route message to pool  -  If NO, patient needs to be scheduled for appointment.  What is the name of the medication or equipment? Atorvastatin 40 mg Patient has appt 11-1 with Dr. Reece Agar.  Have you contacted your pharmacy to request a refill? no   Which pharmacy would you like this sent to? [facility]   Patient notified that their request is being sent to the clinical staff for review and that they should receive a response within 2 business days.

## 2023-08-19 NOTE — Telephone Encounter (Signed)
LMTCB to schedule appt Letter mailed 

## 2023-08-20 NOTE — Telephone Encounter (Signed)
LMOVM refill sent to pharmacy 

## 2023-08-27 ENCOUNTER — Encounter: Payer: Self-pay | Admitting: Family Medicine

## 2023-08-27 ENCOUNTER — Ambulatory Visit (INDEPENDENT_AMBULATORY_CARE_PROVIDER_SITE_OTHER): Payer: Medicare Other | Admitting: Family Medicine

## 2023-08-27 VITALS — BP 129/86 | HR 68 | Temp 98.7°F | Ht 64.0 in | Wt 203.0 lb

## 2023-08-27 DIAGNOSIS — R002 Palpitations: Secondary | ICD-10-CM

## 2023-08-27 DIAGNOSIS — R7303 Prediabetes: Secondary | ICD-10-CM

## 2023-08-27 DIAGNOSIS — E559 Vitamin D deficiency, unspecified: Secondary | ICD-10-CM

## 2023-08-27 DIAGNOSIS — Z130 Encounter for screening for diseases of the blood and blood-forming organs and certain disorders involving the immune mechanism: Secondary | ICD-10-CM

## 2023-08-27 DIAGNOSIS — E785 Hyperlipidemia, unspecified: Secondary | ICD-10-CM | POA: Diagnosis not present

## 2023-08-27 DIAGNOSIS — J302 Other seasonal allergic rhinitis: Secondary | ICD-10-CM

## 2023-08-27 DIAGNOSIS — R7989 Other specified abnormal findings of blood chemistry: Secondary | ICD-10-CM

## 2023-08-27 MED ORDER — ATORVASTATIN CALCIUM 40 MG PO TABS: 40.0000 mg | ORAL_TABLET | Freq: Every day | ORAL | 3 refills | Status: DC

## 2023-08-27 MED ORDER — FLUTICASONE PROPIONATE 50 MCG/ACT NA SUSP: 2.0000 | Freq: Every day | NASAL | 6 refills | Status: AC

## 2023-08-27 NOTE — Progress Notes (Signed)
Joyce Jenkins is a 72 y.o. female presents to office today for annual physical exam examination.    Concerns today include: 1. Heart palpitations She reports that she is been having some intermittent heart palpitations.  Almost feels like she is having some type of panic attack but she is not anxious about anything other than the norm.  Her mom has recurrent breast cancer and this is despite treatments.  She is pretty much "giving up".  She just got back from the beach trying to help and visit with her.  Her other 2 brothers are the primary caregivers there.  She also notes a mild episode of vertigo last week and it seemed to be related to use of Zyrtec.  She since resume Flonase only and her sinuses and vertigo have gotten better.  2.  Hyperlipidemia, elevated blood pressure reading She reports normal blood pressures at home.  Compliant with Lipitor.  No chest pain, shortness of breath.   Occupation: retired, Substance use: none There are no preventive care reminders to display for this patient.  Refills needed today: lipitor  Immunization History  Administered Date(s) Administered   Pneumococcal Conjugate-13 09/24/2016   Pneumococcal Polysaccharide-23 07/15/2015   Tdap 04/04/2013   Past Medical History:  Diagnosis Date   Allergy    Colon polyps    Diverticulosis    GERD (gastroesophageal reflux disease)    Hyperlipidemia    Obesity    Social History   Socioeconomic History   Marital status: Married    Spouse name: Weston Brass   Number of children: 2   Years of education: Not on file   Highest education level: Bachelor's degree (e.g., BA, AB, BS)  Occupational History   Occupation: Retired Runner, broadcasting/film/video  Tobacco Use   Smoking status: Never   Smokeless tobacco: Never  Vaping Use   Vaping status: Never Used  Substance and Sexual Activity   Alcohol use: Yes    Alcohol/week: 2.0 standard drinks of alcohol    Types: 2 Glasses of wine per week   Drug use: No   Sexual activity:  Not on file  Other Topics Concern   Not on file  Social History Narrative   Not on file   Social Determinants of Health   Financial Resource Strain: Low Risk  (08/26/2023)   Overall Financial Resource Strain (CARDIA)    Difficulty of Paying Living Expenses: Not hard at all  Food Insecurity: No Food Insecurity (08/26/2023)   Hunger Vital Sign    Worried About Running Out of Food in the Last Year: Never true    Ran Out of Food in the Last Year: Never true  Transportation Needs: No Transportation Needs (08/26/2023)   PRAPARE - Administrator, Civil Service (Medical): No    Lack of Transportation (Non-Medical): No  Physical Activity: Insufficiently Active (08/26/2023)   Exercise Vital Sign    Days of Exercise per Week: 2 days    Minutes of Exercise per Session: 40 min  Stress: No Stress Concern Present (08/26/2023)   [employer] of Occupational Health - Occupational Stress Questionnaire    Feeling of Stress : Not at all  Social Connections: Socially Integrated (08/26/2023)   Social Connection and Isolation Panel [NHANES]    Frequency of Communication with Friends and Family: Three times a week    Frequency of Social Gatherings with Friends and Family: More than three times a week    Attends Religious Services: More than 4 times per year  Active Member of Clubs or Organizations: Yes    Attends Banker Meetings: More than 4 times per year    Marital Status: Married  Catering manager Violence: Not At Risk (04/26/2023)   Humiliation, Afraid, Rape, and Kick questionnaire    Fear of Current or Ex-Partner: No    Emotionally Abused: No    Physically Abused: No    Sexually Abused: No   Past Surgical History:  Procedure Laterality Date   CHOLECYSTECTOMY     COLONOSCOPY     THYROID CYST EXCISION     Family History  Problem Relation Age of Onset   Breast cancer Mother        recurrent and metastatic   Colon polyps Mother    Atrial fibrillation Mother     Cancer Father        lung and kidney   Heart disease Father    Diabetes Paternal Grandmother    Colon cancer Neg Hx    Esophageal cancer Neg Hx    Liver cancer Neg Hx    Pancreatic cancer Neg Hx    Rectal cancer Neg Hx    Stomach cancer Neg Hx     Current Outpatient Medications:    atorvastatin (LIPITOR) 40 MG tablet, Take 1 tablet (40 mg total) by mouth daily., Disp: 90 tablet, Rfl: 3   famotidine (PEPCID) 20 MG tablet, Take by mouth., Disp: , Rfl:    Multiple Vitamin (MULTIVITAMIN) tablet, Take 1 tablet by mouth daily., Disp: , Rfl:    fluticasone (FLONASE) 50 MCG/ACT nasal spray, Place 2 sprays into both nostrils daily., Disp: 16 g, Rfl: 6  Allergies  Allergen Reactions   Salac [Salicylic Acid] Swelling   Aspirin Swelling   Ibuprofen Other (See Comments)    Pt. Was told not to take Ibuprofen, based on her anaphylactic reaction to ASA     ROS: Review of Systems Pertinent items noted in HPI and remainder of comprehensive ROS otherwise negative.    Physical exam BP 129/86   Pulse 68   Temp 98.7 F (37.1 C)   Ht 5\' 4"  (1.626 m)   Wt 203 lb (92.1 kg)   SpO2 99%   BMI 34.84 kg/m  General appearance: alert, cooperative, appears stated age, and morbidly obese Head: Normocephalic, without obvious abnormality, atraumatic Eyes: negative findings: lids and lashes normal, conjunctivae and sclerae normal, corneas clear, pupils equal, round, reactive to light and accomodation, and wears glasses Ears: normal TM's and external ear canals both ears Nose: Nares normal. Septum midline. Mucosa normal. No drainage or sinus tenderness. Throat: lips, mucosa, and tongue normal; teeth and gums normal Neck: no adenopathy, supple, symmetrical, trachea midline, and thyroid not enlarged, symmetric, no tenderness/mass/nodules Back: symmetric, no curvature. ROM normal. No CVA tenderness. Lungs: clear to auscultation bilaterally Heart: regular rate and rhythm, S1, S2 normal, no murmur, click,  rub or gallop Abdomen: soft, non-tender; bowel sounds normal; no masses,  no organomegaly Extremities: extremities normal, atraumatic, no cyanosis or edema Pulses: 2+ and symmetric Skin: Skin color, texture, turgor normal. No rashes or lesions Lymph nodes: Cervical, supraclavicular, and axillary nodes normal.  Neuro: no focal neurologic deficits. No nystagmus     08/27/2023   12:58 PM 04/26/2023   11:19 AM 04/06/2023    8:27 AM  Depression screen PHQ 2/9  Decreased Interest 0 0 0  Down, Depressed, Hopeless 0 0 0  PHQ - 2 Score 0 0 0  Altered sleeping 0 0 0  Tired, decreased energy 0  0 0  Change in appetite 0 0 0  Feeling bad or failure about yourself  0 0 0  Trouble concentrating 0 0 0  Moving slowly or fidgety/restless 0 0 0  Suicidal thoughts 0 0 0  PHQ-9 Score 0 0 0  Difficult doing work/chores Not difficult at all Not difficult at all Not difficult at all      08/27/2023   12:58 PM 04/06/2023    8:27 AM 07/06/2022   10:23 AM 05/01/2022    1:20 PM  GAD 7 : Generalized Anxiety Score  Nervous, Anxious, on Edge 0 0 0 0  Control/stop worrying 0 0 0 0  Worry too much - different things 0 0 0 0  Trouble relaxing 0 0 0 0  Restless 0 0 0 0  Easily annoyed or irritable 0 0 0 0  Afraid - awful might happen 0 0 0 0  Total GAD 7 Score 0 0 0 0  Anxiety Difficulty Not difficult at all Not difficult at all Not difficult at all Not difficult at all     Assessment/ Plan: Marlou Sa here for annual physical exam.   Dyslipidemia - Plan: CMP14+EGFR, Lipid panel, TSH, T4, free, atorvastatin (LIPITOR) 40 MG tablet  Pre-diabetes - Plan: Bayer DCA Hb A1c Waived  Morbid obesity (HCC) - Plan: CMP14+EGFR, Lipid panel, TSH, T4, free, Bayer DCA Hb A1c Waived  Vitamin D deficiency - Plan: VITAMIN D 25 Hydroxy (Vit-D Deficiency, Fractures)  Screening, anemia, deficiency, iron - Plan: CBC with Differential  Elevated liver function tests - Plan: CBC with Differential  Heart palpitations -  Plan: Magnesium  Seasonal allergic rhinitis, unspecified trigger - Plan: fluticasone (FLONASE) 50 MCG/ACT nasal spray  Declines influenza, shingles and tetanus shot today.  Fasting labs ordered.  Will collect magnesium as well given reports of heart palpitations.  I suspect these are PVCs but in the event that they continue we discussed consideration for Holter monitor  Check vitamin D given morbid obesity.  Encouraged balanced lifestyle  Check CBC given elevation of liver function test and again heart palpitations.  Denied any bleeding today  Flonase renewed  Counseled on healthy lifestyle choices, including diet (rich in fruits, vegetables and lean meats and low in salt and simple carbohydrates) and exercise (at least 30 minutes of moderate physical activity daily).  Patient to follow up 1 year for CPE, sooner if concerns arise  Kyan Giannone M. Nadine Counts, DO

## 2023-08-30 ENCOUNTER — Other Ambulatory Visit: Payer: Medicare Other

## 2023-08-30 DIAGNOSIS — R7989 Other specified abnormal findings of blood chemistry: Secondary | ICD-10-CM

## 2023-08-30 DIAGNOSIS — Z130 Encounter for screening for diseases of the blood and blood-forming organs and certain disorders involving the immune mechanism: Secondary | ICD-10-CM

## 2023-08-30 DIAGNOSIS — R7303 Prediabetes: Secondary | ICD-10-CM

## 2023-08-30 DIAGNOSIS — E785 Hyperlipidemia, unspecified: Secondary | ICD-10-CM

## 2023-08-30 DIAGNOSIS — E559 Vitamin D deficiency, unspecified: Secondary | ICD-10-CM

## 2023-08-30 DIAGNOSIS — R002 Palpitations: Secondary | ICD-10-CM

## 2023-08-30 LAB — BAYER DCA HB A1C WAIVED: HB A1C (BAYER DCA - WAIVED): 5.7 % — ABNORMAL HIGH (ref 4.8–5.6)

## 2023-08-31 LAB — CMP14+EGFR
ALT: 24 IU/L (ref 0–32)
AST: 23 IU/L (ref 0–40)
Albumin: 3.9 g/dL (ref 3.8–4.8)
Alkaline Phosphatase: 114 IU/L (ref 44–121)
BUN/Creatinine Ratio: 16 (ref 12–28)
BUN: 12 mg/dL (ref 8–27)
Bilirubin Total: 0.8 mg/dL (ref 0.0–1.2)
CO2: 25 mmol/L (ref 20–29)
Calcium: 9.3 mg/dL (ref 8.7–10.3)
Chloride: 105 mmol/L (ref 96–106)
Creatinine, Ser: 0.73 mg/dL (ref 0.57–1.00)
Globulin, Total: 2.5 g/dL (ref 1.5–4.5)
Glucose: 109 mg/dL — ABNORMAL HIGH (ref 70–99)
Potassium: 4.3 mmol/L (ref 3.5–5.2)
Sodium: 141 mmol/L (ref 134–144)
Total Protein: 6.4 g/dL (ref 6.0–8.5)
eGFR: 87 mL/min/{1.73_m2} (ref >59)

## 2023-08-31 LAB — CBC WITH DIFFERENTIAL/PLATELET
Basophils Absolute: 0 10*3/uL (ref 0.0–0.2)
Basos: 1 %
EOS (ABSOLUTE): 0.2 10*3/uL (ref 0.0–0.4)
Eos: 3 %
Hematocrit: 47.2 % — ABNORMAL HIGH (ref 34.0–46.6)
Hemoglobin: 15.6 g/dL (ref 11.1–15.9)
Immature Grans (Abs): 0 10*3/uL (ref 0.0–0.1)
Immature Granulocytes: 0 %
Lymphocytes Absolute: 2.2 10*3/uL (ref 0.7–3.1)
Lymphs: 30 %
MCH: 32.8 pg (ref 26.6–33.0)
MCHC: 33.1 g/dL (ref 31.5–35.7)
MCV: 99 fL — ABNORMAL HIGH (ref 79–97)
Monocytes Absolute: 0.7 10*3/uL (ref 0.1–0.9)
Monocytes: 9 %
Neutrophils Absolute: 4.1 10*3/uL (ref 1.4–7.0)
Neutrophils: 57 %
Platelets: 234 10*3/uL (ref 150–450)
RBC: 4.76 x10E6/uL (ref 3.77–5.28)
RDW: 12.3 % (ref 11.7–15.4)
WBC: 7.3 10*3/uL (ref 3.4–10.8)

## 2023-08-31 LAB — LIPID PANEL
Chol/HDL Ratio: 2.5 ratio (ref 0.0–4.4)
Cholesterol, Total: 124 mg/dL (ref 100–199)
HDL: 50 mg/dL (ref >39)
LDL Chol Calc (NIH): 52 mg/dL (ref 0–99)
Triglycerides: 125 mg/dL (ref 0–149)
VLDL Cholesterol Cal: 22 mg/dL (ref 5–40)

## 2023-08-31 LAB — TSH: TSH: 2.32 u[IU]/mL (ref 0.450–4.500)

## 2023-08-31 LAB — VITAMIN D 25 HYDROXY (VIT D DEFICIENCY, FRACTURES): Vit D, 25-Hydroxy: 38.3 ng/mL (ref 30.0–100.0)

## 2023-08-31 LAB — MAGNESIUM: Magnesium: 2.1 mg/dL (ref 1.6–2.3)

## 2023-08-31 LAB — T4, FREE: Free T4: 1.27 ng/dL (ref 0.82–1.77)

## 2023-10-22 ENCOUNTER — Encounter: Payer: Self-pay | Admitting: Family Medicine

## 2023-10-22 ENCOUNTER — Encounter: Payer: Self-pay | Admitting: Family

## 2023-10-22 ENCOUNTER — Telehealth (INDEPENDENT_AMBULATORY_CARE_PROVIDER_SITE_OTHER): Payer: Medicare Other | Admitting: Family

## 2023-10-22 DIAGNOSIS — L989 Disorder of the skin and subcutaneous tissue, unspecified: Secondary | ICD-10-CM | POA: Diagnosis not present

## 2023-10-22 MED ORDER — DOXYCYCLINE HYCLATE 100 MG PO TABS: 100.0000 mg | ORAL_TABLET | Freq: Two times a day (BID) | ORAL | 0 refills | Status: DC

## 2023-10-22 NOTE — Progress Notes (Signed)
Virtual Visit Consent   [name], you are scheduled for a virtual visit with a [facility] provider today. Just as with appointments in the office, your consent must be obtained to participate. Your consent will be active for this visit and any virtual visit you may have with one of our providers in the next 365 days. If you have a MyChart account, a copy of this consent can be sent to you electronically.  As this is a virtual visit, video technology does not allow for your provider to perform a traditional examination. This may limit your provider's ability to fully assess your condition. If your provider identifies any concerns that need to be evaluated in person or the need to arrange testing (such as labs, EKG, etc.), we will make arrangements to do so. Although advances in technology are sophisticated, we cannot ensure that it will always work on either your end or our end. If the connection with a video visit is poor, the visit may have to be switched to a telephone visit. With either a video or telephone visit, we are not always able to ensure that we have a secure connection.  By engaging in this virtual visit, you consent to the provision of healthcare and authorize for your insurance to be billed (if applicable) for the services provided during this visit. Depending on your insurance coverage, you may receive a charge related to this service.  I need to obtain your verbal consent now. Are you willing to proceed with your visit today? Joyce Jenkins has provided verbal consent on 10/22/2023 for a virtual visit (video or telephone). Jannifer Rodney, FNP  Date: 10/22/2023 9:49 AM  Virtual Visit via Video Note   I, Jannifer Rodney, connected with  Joyce Jenkins  ([id number], 1951/09/21) on 10/22/23 at  9:55 AM EST by a video-enabled telemedicine application and verified that I am speaking with the correct person using two identifiers.  Location: Patient: Virtual Visit Location Patient:  Home Provider: Virtual Visit Location Provider: Home Office   I discussed the limitations of evaluation and management by telemedicine and the availability of in person appointments. The patient expressed understanding and agreed to proceed.    History of Present Illness: Joyce Jenkins is a 72 y.o. who identifies as a female who was assigned female at birth, and is being seen today for "bump" on right lower leg. Reports seeing a bump Sunday and has gradually worsen. Has mild aching pain of 4 out 10. Denies fever, injury, drainage, or insect bite. She placed a steroid cream without relief.   HPI: HPI  Problems:  Patient Active Problem List   Diagnosis Date Noted   Non-recurrent acute suppurative otitis media of right ear without spontaneous rupture of tympanic membrane 04/06/2023   Seasonal allergic rhinitis 04/06/2023   Morbid obesity (HCC) 10/30/2019   Elevated liver function tests 10/30/2019   Post-menopausal 03/23/2017   Vitamin D deficiency 03/23/2017   Dyslipidemia 07/15/2015   Elevated glucose 07/15/2015   Obesity (BMI 30-39.9) 07/15/2015   Thyromegaly 07/15/2015    Allergies:  Allergies  Allergen Reactions   Salac [Salicylic Acid] Swelling   Aspirin Swelling   Ibuprofen Other (See Comments)    Pt. Was told not to take Ibuprofen, based on her anaphylactic reaction to ASA   Medications:  Current Outpatient Medications:    doxycycline (VIBRA-TABS) 100 MG tablet, Take 1 tablet (100 mg total) by mouth 2 (two) times daily., Disp: 20 tablet, Rfl: 0   atorvastatin (  LIPITOR) 40 MG tablet, Take 1 tablet (40 mg total) by mouth daily., Disp: 90 tablet, Rfl: 3   famotidine (PEPCID) 20 MG tablet, Take by mouth., Disp: , Rfl:    fluticasone (FLONASE) 50 MCG/ACT nasal spray, Place 2 sprays into both nostrils daily., Disp: 16 g, Rfl: 6   Multiple Vitamin (MULTIVITAMIN) tablet, Take 1 tablet by mouth daily., Disp: , Rfl:   Observations/Objective: Patient is well-developed,  well-nourished in no acute distress.  Resting comfortably  at home.  Head is normocephalic, atraumatic.  No labored breathing.  Speech is clear and coherent with logical content.  Patient is alert and oriented at baseline.  Dime size lesion on right medial leg that has slightly erythemas   Assessment and Plan: 1. Skin lesion (Primary) - doxycycline (VIBRA-TABS) 100 MG tablet; Take 1 tablet (100 mg total) by mouth 2 (two) times daily.  Dispense: 20 tablet; Refill: 0  Start doxycyline  Mark area with sharpie, let us know if redness spreads past area Keep clean and dry Report any fevers, discharge, or increase pain  Follow Up Instructions: I discussed the assessment and treatment plan with the patient. The patient was provided an opportunity to ask questions and all were answered. The patient agreed with the plan and demonstrated an understanding of the instructions.  A copy of instructions were sent to the patient via MyChart unless otherwise noted below.     The patient was advised to call back or seek an in-person evaluation if the symptoms worsen or if the condition fails to improve as anticipated.    Jannifer Rodney, FNP

## 2024-02-29 ENCOUNTER — Other Ambulatory Visit: Payer: Self-pay | Admitting: Family Medicine

## 2024-02-29 DIAGNOSIS — Z1231 Encounter for screening mammogram for malignant neoplasm of breast: Secondary | ICD-10-CM

## 2024-04-05 ENCOUNTER — Ambulatory Visit
Admission: RE | Admit: 2024-04-05 | Discharge: 2024-04-05 | Disposition: A | Discharge status: Home / Self Care | Source: Ambulatory Visit | Attending: Family Medicine | Admitting: Family Medicine

## 2024-04-05 DIAGNOSIS — Z1231 Encounter for screening mammogram for malignant neoplasm of breast: Secondary | ICD-10-CM

## 2024-04-22 ENCOUNTER — Encounter: Payer: Self-pay | Admitting: Family Medicine

## 2024-04-26 ENCOUNTER — Telehealth: Payer: Self-pay

## 2024-04-26 NOTE — Telephone Encounter (Signed)
 Copied from CRM [id number]. Topic: General - Other >> Apr 26, 2024 10:24 AM Travis F wrote: Reason for CRM: Patient is calling in because she has an AWV telephone visit scheduled for today and she said the nurse called her but she missed the call. Please follow up with patient.

## 2024-04-26 NOTE — Telephone Encounter (Signed)
 Appt made

## 2024-05-19 IMAGING — MG MM DIGITAL SCREENING BILAT W/ TOMO AND CAD
8 series · 8 of 24 positions shown · non-contrast
Comparison: Previous exam(s).

CLINICAL DATA: Screening.

EXAM:
DIGITAL SCREENING BILATERAL MAMMOGRAM WITH TOMOSYNTHESIS AND CAD
TECHNIQUE: Bilateral screening digital craniocaudal and mediolateral oblique
mammograms were obtained. Bilateral screening digital breast
tomosynthesis was performed. The images were evaluated with
computer-aided detection.

[R CC synth-2D]
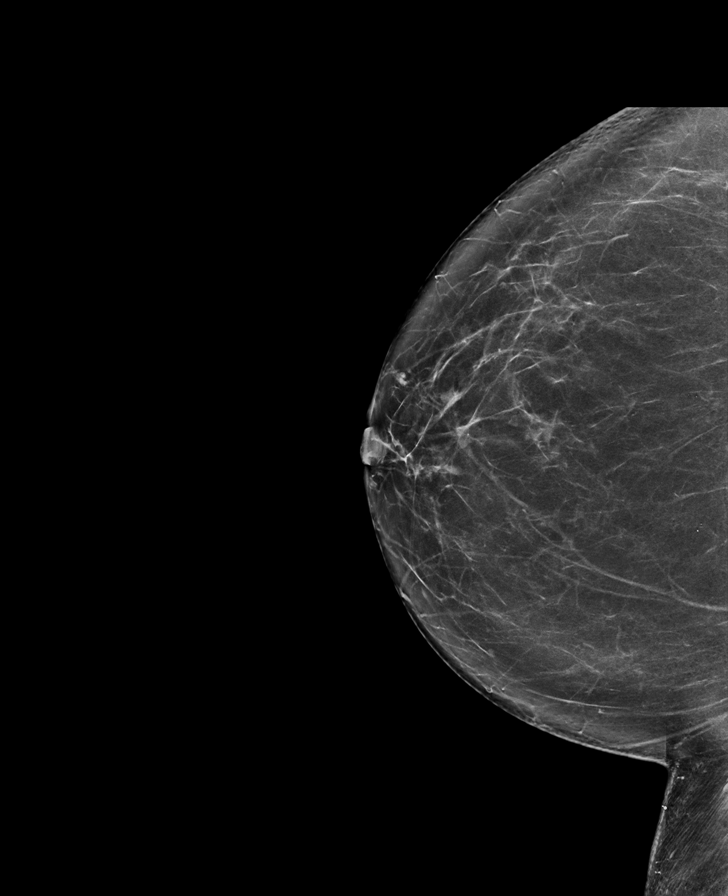

[L MLO synth-2D]
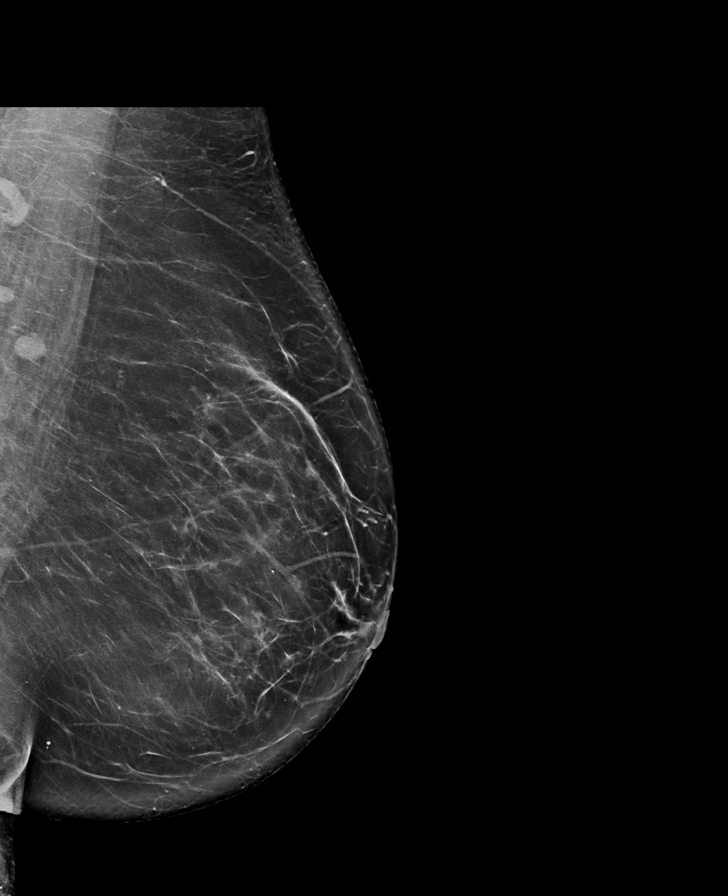

[L CC synth-2D]
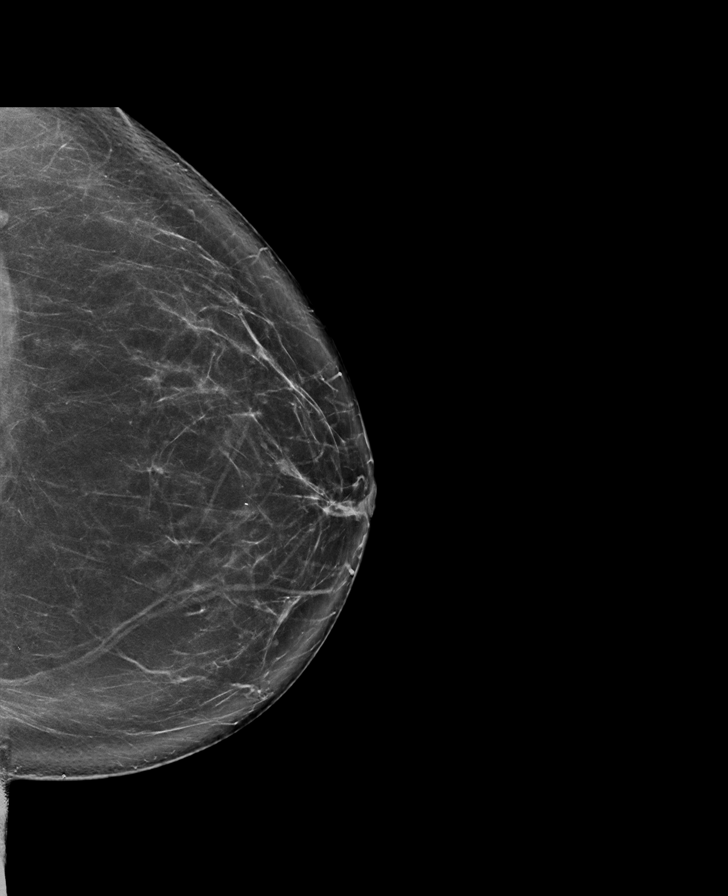

[R MLO synth-2D]
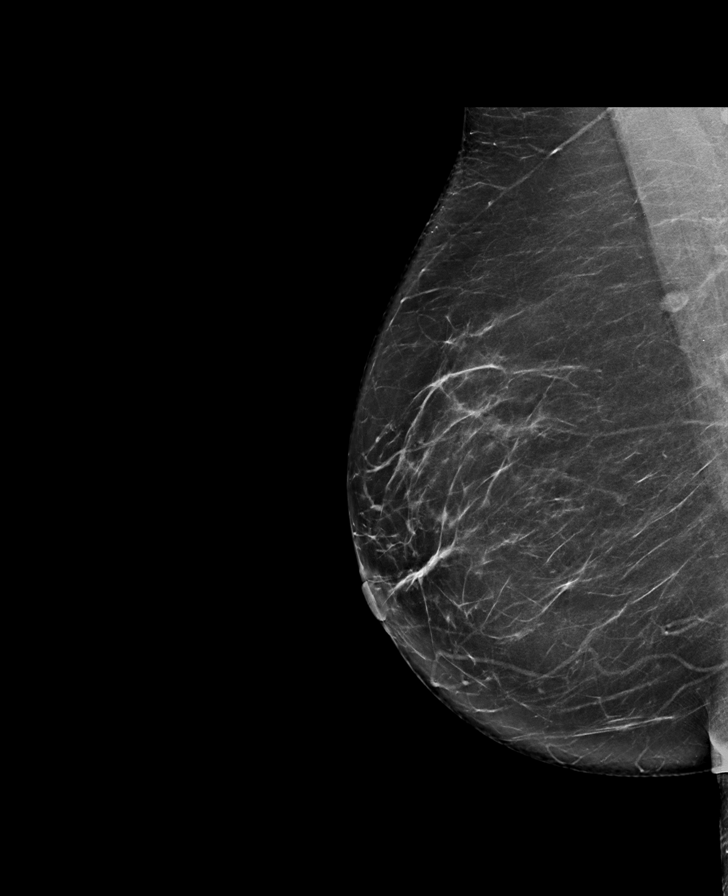

[L CC tomo · tomo slice 43/85.0]
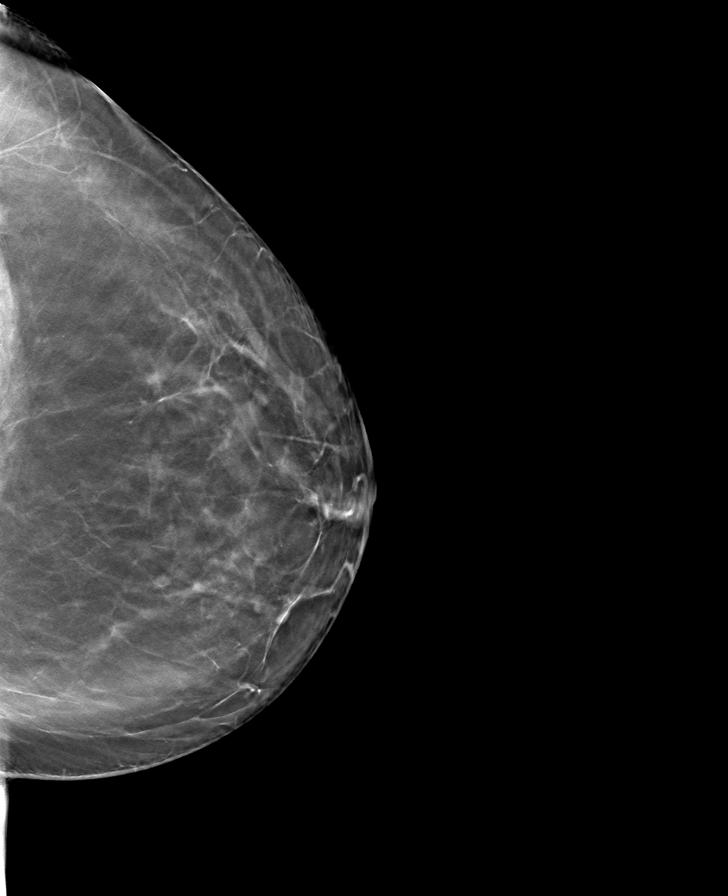

[R CC tomo · tomo slice 41/80.0]
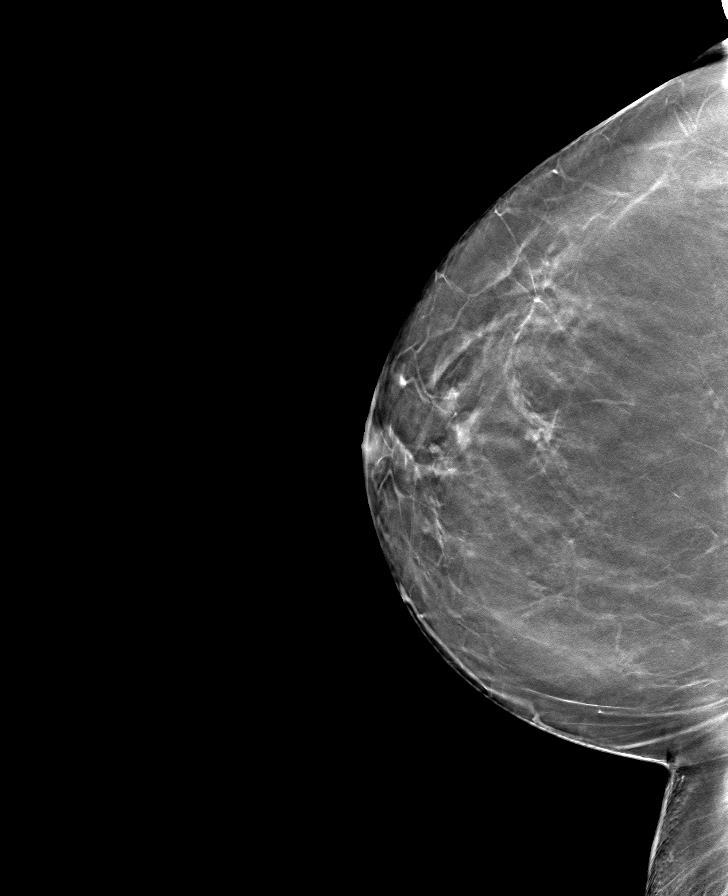

[L MLO tomo · tomo slice 43/84.0]
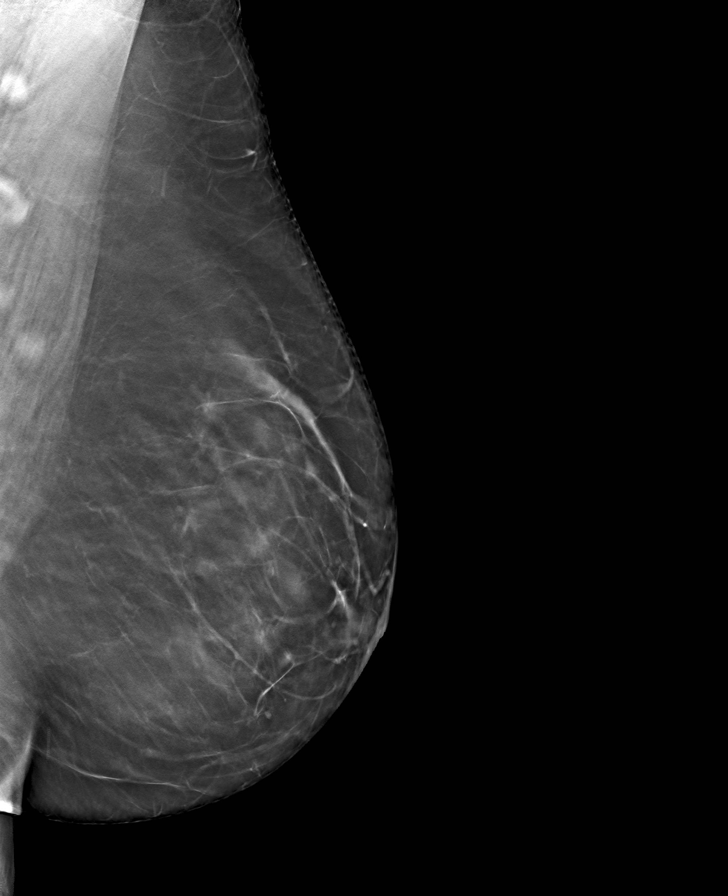

[R MLO tomo · tomo slice 43/84.0]
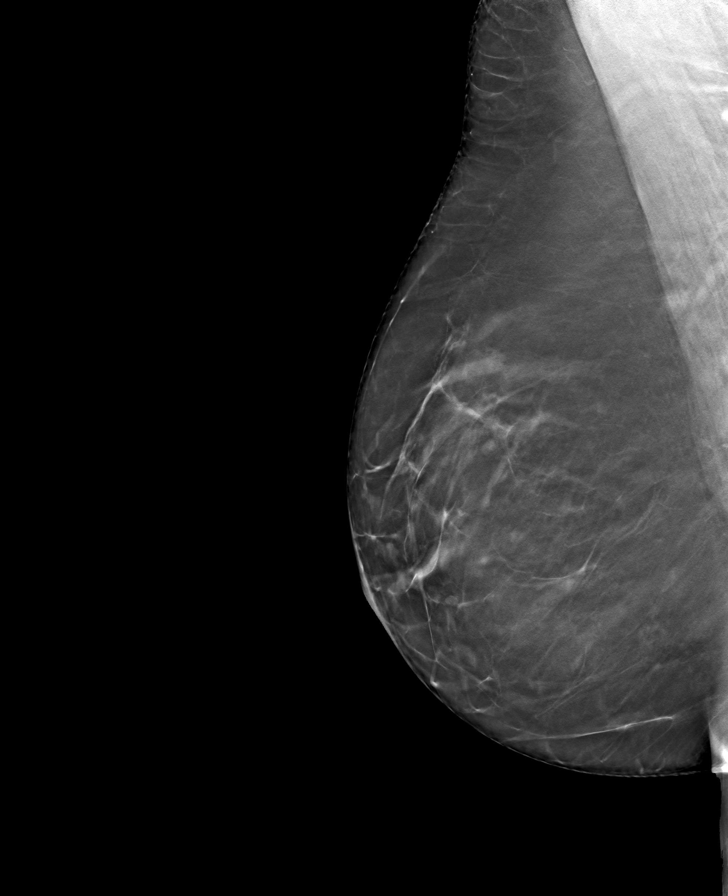

[8 of 24 positions shown; findings below may reference images not displayed]

ACR Breast Density Category b: There are scattered areas of
fibroglandular density.
FINDINGS: There are no findings suspicious for malignancy.
IMPRESSION: No mammographic evidence of malignancy. A result letter of this
screening mammogram will be mailed directly to the patient.

RECOMMENDATION:
Screening mammogram in one year. (Code:51-O-LD2)

BI-RADS CATEGORY  1: Negative.

## 2024-08-14 ENCOUNTER — Ambulatory Visit

## 2024-08-28 ENCOUNTER — Other Ambulatory Visit: Payer: Self-pay | Admitting: Family Medicine

## 2024-08-28 ENCOUNTER — Ambulatory Visit

## 2024-08-28 ENCOUNTER — Ambulatory Visit: Payer: Medicare Other | Admitting: Family Medicine

## 2024-08-28 ENCOUNTER — Encounter: Payer: Self-pay | Admitting: Family Medicine

## 2024-08-28 VITALS — BP 135/81 | HR 76 | Temp 97.7°F | Ht 64.0 in | Wt 198.0 lb

## 2024-08-28 DIAGNOSIS — E66811 Obesity, class 1: Secondary | ICD-10-CM

## 2024-08-28 DIAGNOSIS — E785 Hyperlipidemia, unspecified: Secondary | ICD-10-CM | POA: Diagnosis not present

## 2024-08-28 DIAGNOSIS — M85852 Other specified disorders of bone density and structure, left thigh: Secondary | ICD-10-CM

## 2024-08-28 DIAGNOSIS — R7303 Prediabetes: Secondary | ICD-10-CM

## 2024-08-28 DIAGNOSIS — E559 Vitamin D deficiency, unspecified: Secondary | ICD-10-CM

## 2024-08-28 DIAGNOSIS — R718 Other abnormality of red blood cells: Secondary | ICD-10-CM

## 2024-08-28 DIAGNOSIS — Z13820 Encounter for screening for osteoporosis: Secondary | ICD-10-CM

## 2024-08-28 DIAGNOSIS — Z780 Asymptomatic menopausal state: Secondary | ICD-10-CM

## 2024-08-28 MED ORDER — ATORVASTATIN CALCIUM 40 MG PO TABS: 40.0000 mg | ORAL_TABLET | Freq: Every day | ORAL | 3 refills | Status: AC

## 2024-08-28 NOTE — Patient Instructions (Signed)
 Preventing Osteoporosis, Adult Osteoporosis is a condition that causes the bones to lose density. This means that the bones become thinner, and the normal spaces in bone tissue become larger. Low bone density can make the bones weak and cause them to break more easily. Osteoporosis cannot always be prevented, but you can take steps to lower your risk of developing this condition. How can this condition affect me? If you develop osteoporosis, you will be more likely to break bones in your wrist, spine, or hip. Even a minor accident or injury can be enough to break weak bones. The bones will also be slower to heal. Osteoporosis can cause other problems as well, such as a stooped posture or trouble with movement. Osteoporosis can occur with aging. As you get older, you may lose bone tissue more quickly, or it may be replaced more slowly. Osteoporosis is more likely to develop if you have poor nutrition or do not get enough calcium or vitamin D. Other lifestyle factors can also play a role. By eating a well-balanced diet and making lifestyle changes, you can help keep your bones strong and healthy, lowering your chances of developing osteoporosis. What can increase my risk? The following factors may make you more likely to develop osteoporosis: Having a family history of the condition. Having poor nutrition or not getting enough calcium or vitamin D. Using certain medicines, such as steroid medicines or anti-seizure medicines. Being any of the following: 59 years of age or older. Female. A woman who has gone through menopause (is postmenopausal). A person who is of European or Asian descent. Using products that contain nicotine or tobacco, such as cigarettes, e-cigarettes, and chewing tobacco. Not being physically active (being sedentary). Having a small body frame. What actions can I take to prevent this? Get enough calcium  Make sure you get enough calcium every day. Calcium is the most important  mineral for bone health. Most people can get enough calcium from their diet, but supplements may be recommended for people who are at risk for osteoporosis. Follow these guidelines: If you are age 31 or younger, aim to get 1,000 milligrams (mg) of calcium every day. If you are older than age 52, aim to get 1,200 mg of calcium every day. Good sources of calcium include: Dairy products, such as low-fat or nonfat milk, cheese, and yogurt. Dark green leafy vegetables, such as bok choy and broccoli. Foods that have had calcium added to them (calcium-fortified foods), such as orange juice, cereal, bread, soy beverages, and tofu products. Nuts, such as almonds. Check nutrition labels to see how much calcium is in a food or drink. Get enough vitamin D Try to get enough vitamin D every day. Vitamin D is the most essential vitamin for bone health. It helps the body absorb calcium. Follow these guidelines for how much vitamin D to get from food: If you are age 36 or younger, aim to get at least 600 international units (IU) every day. Your health care provider may suggest more. If you are older than age 67, aim to get at least 800 international units every day. Your health care provider may suggest more. Good sources of vitamin D in your diet include: Egg yolks. Oily fish, such as salmon, sardines, and tuna. Milk and cereal fortified with vitamin D. Your body also makes vitamin D when you are out in the sun. Exposing the bare skin on your face, arms, legs, or back to the sun for no more than 30 minutes a  day, 2 times a week is more than enough. Beyond that, make sure you use sunblock to protect your skin from sunburn, which increases your risk for skin cancer. Exercise  Stay active and get exercise every day. Ask your health care provider what types of exercise are best for you. Weight-bearing and strength-building activities are important for building and maintaining healthy bones. Some examples of these  types of activities include: Walking and hiking. Jogging and running. Dancing. Gym exercises and lifting weights. Tennis and racquetball. Climbing stairs. Tai chi. Make other lifestyle changes Do not use any products that contain nicotine or tobacco, such as cigarettes, e-cigarettes, and chewing tobacco. If you need help quitting, ask your health care provider. Lose weight if you are overweight. If you drink alcohol: Limit how much you use to: 0-1 drink a day for women who are not pregnant. 0-2 drinks a day for men. Be aware of how much alcohol is in your drink. In the [place] one drink equals one 12 oz bottle of beer (355 mL), one 5 oz glass of wine (148 mL), or one 1 oz glass of hard liquor (44 mL). Where to find support If you need help making changes to prevent osteoporosis, talk with your health care provider. You can ask for a referral to a dietitian and a physical therapist. Where to find more information Learn more about osteoporosis from: NIH Osteoporosis and Related Bone Diseases National Resource Center: www.bones.http://www.myers.net/ [place] Office on [employer] Health: http://hoffman.com/ National Osteoporosis Foundation: [employer] Summary Osteoporosis is a condition that causes weak bones that are more likely to break. Eat a healthy diet, making sure you get enough calcium and vitamin D, and stay active by getting regular exercise to help prevent osteoporosis. Other ways to reduce your risk of osteoporosis include maintaining a healthy weight and avoiding alcohol and products that contain nicotine or tobacco. This information is not intended to replace advice given to you by your health care provider. Make sure you discuss any questions you have with your health care provider. Document Revised: 06/15/2023 Document Reviewed: 06/15/2023 Elsevier Patient Education  2024 [employer].

## 2024-08-28 NOTE — Progress Notes (Signed)
 Joyce Jenkins is a 74 y.o. female presents to office today for annual physical exam examination.    She reports that she has been doing well overall.  She has had some bilateral lower leg pain.  It is been only going on for the last several days and may be related to her having going up and down stairs at a family member's house multiple times per day.  She denies any restless leg symptoms or cramping in the lower extremities.  Tylenol relieves the symptoms.  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  She occasionally has some urinary leakage and does Kegel exercises intermittently for this.  She does not report any structured physical activity but does report balanced diet and hydrates well with water with only 1 cup of coffee per day.  She declines a DEXA scan today and understands the risk versus benefit.  Amenable to tetanus shot  Occupation: Retired, Substance use: None Health Maintenance Due  Topic Date Due   DTaP/Tdap/Td (2 - Td or Tdap) 04/05/2023   DEXA SCAN  10/17/2023   Medicare Annual Wellness (AWV)  04/25/2024    Immunization History  Administered Date(s) Administered   Pneumococcal Conjugate-13 09/24/2016   Pneumococcal Polysaccharide-23 07/15/2015   Tdap 04/04/2013   Past Medical History:  Diagnosis Date   Allergy    Colon polyps    Diverticulosis    GERD (gastroesophageal reflux disease)    Hyperlipidemia    Obesity    Social History   Socioeconomic History   Marital status: Married    Spouse name: Karleen   Number of children: 2   Years of education: Not on file   Highest education level: Bachelor's degree (e.g., BA, AB, BS)  Occupational History   Occupation: Retired runner, broadcasting/film/video  Tobacco Use   Smoking status: Never   Smokeless tobacco: Never  Vaping Use   Vaping status: Never Used  Substance and Sexual Activity   Alcohol use: Yes    Alcohol/week: 2.0 standard  drinks of alcohol    Types: 2 Glasses of wine per week   Drug use: No   Sexual activity: Not on file  Other Topics Concern   Not on file  Social History Narrative   Not on file   Social Drivers of Health   Financial Resource Strain: Low Risk  (08/10/2024)   Overall Financial Resource Strain (CARDIA)    Difficulty of Paying Living Expenses: Not hard at all  Food Insecurity: No Food Insecurity (08/10/2024)   Hunger Vital Sign    Worried About Running Out of Food in the Last Year: Never true    Ran Out of Food in the Last Year: Never true  Transportation Needs: No Transportation Needs (08/10/2024)   PRAPARE - Administrator, Civil Service (Medical): No    Lack of Transportation (Non-Medical): No  Physical Activity: Insufficiently Active (08/10/2024)   Exercise Vital Sign    Days of Exercise per Week: 2 days    Minutes of Exercise per Session: 50 min  Stress: No Stress Concern Present (08/10/2024)   [employer] of Occupational Health - Occupational Stress Questionnaire    Feeling of Stress: Only a little  Social Connections: Socially Integrated (08/10/2024)   Social Connection and Isolation Panel    Frequency of Communication with Friends and Family: Once a week    Frequency of Social Gatherings with Friends and Family: Twice a  week    Attends Religious Services: More than 4 times per year    Active Member of Clubs or Organizations: Yes    Attends Banker Meetings: Not on file    Marital Status: Married  Intimate Partner Violence: Not At Risk (08/08/2024)   Received from [facility]   HITS    Over the last 12 months how often did your partner physically hurt you?: Never    Over the last 12 months how often did your partner insult you or talk down to you?: Never    Over the last 12 months how often did your partner threaten you with physical harm?: Never    Over the last 12 months how often did your partner scream or curse at you?: Never   Past  Surgical History:  Procedure Laterality Date   CHOLECYSTECTOMY     COLONOSCOPY     THYROID CYST EXCISION     Family History  Problem Relation Age of Onset   Breast cancer Mother        recurrent and metastatic   Colon polyps Mother    Atrial fibrillation Mother    Cancer Father        lung and kidney   Heart disease Father    Diabetes Paternal Grandmother    Colon cancer Neg Hx    Esophageal cancer Neg Hx    Liver cancer Neg Hx    Pancreatic cancer Neg Hx    Rectal cancer Neg Hx    Stomach cancer Neg Hx     Current Outpatient Medications:    famotidine (PEPCID) 20 MG tablet, Take by mouth., Disp: , Rfl:    fluticasone  (FLONASE ) 50 MCG/ACT nasal spray, Place 2 sprays into both nostrils daily., Disp: 16 g, Rfl: 6   Multiple Vitamin (MULTIVITAMIN) tablet, Take 1 tablet by mouth daily., Disp: , Rfl:    atorvastatin  (LIPITOR) 40 MG tablet, Take 1 tablet (40 mg total) by mouth daily., Disp: 90 tablet, Rfl: 3  Allergies  Allergen Reactions   Salac [Salicylic Acid] Swelling   Aspirin Swelling   Ibuprofen Other (See Comments)    Pt. Was told not to take Ibuprofen, based on her anaphylactic reaction to ASA     ROS: Review of Systems Pertinent items noted in HPI and remainder of comprehensive ROS otherwise negative.    Physical exam BP 135/81   Pulse 76   Temp 97.7 F (36.5 C)   Ht 5' 4 (1.626 m)   Wt 198 lb (89.8 kg)   SpO2 97%   BMI 33.99 kg/m  General appearance: alert, cooperative, appears stated age, and moderately obese Head: Normocephalic, without obvious abnormality, atraumatic Eyes: negative findings: lids and lashes normal, conjunctivae and sclerae normal, corneas clear, and pupils equal, round, reactive to light and accomodation Ears: normal TM's and external ear canals both ears Nose: Nares normal. Septum midline. Mucosa normal. No drainage or sinus tenderness. Throat: lips, mucosa, and tongue normal; teeth and gums normal Neck: no adenopathy, no carotid  bruit, supple, symmetrical, trachea midline, and thyroid not enlarged, symmetric, no tenderness/mass/nodules Back: symmetric, no curvature. ROM normal. No CVA tenderness. Lungs: clear to auscultation bilaterally Heart: regular rate and rhythm, S1, S2 normal, no murmur, click, rub or gallop Abdomen: Obese, soft, nontender and nondistended. Extremities: Has varicose veins along the ankles.  They are warm, well-perfused with no edema Pulses: 2+ and symmetric Skin: Postinflammatory hyperpigmentation noted along the right lower extremity.  Varicose veins as above Lymph nodes: Anterior  cervical and supraclavicular lymph nodes without enlargement Neurologic: Alert and oriented X 3, normal strength and tone. Normal symmetric reflexes. Normal coordination and gait      08/27/2023   12:58 PM 04/26/2023   11:19 AM 04/06/2023    8:27 AM  Depression screen PHQ 2/9  Decreased Interest 0 0 0  Down, Depressed, Hopeless 0 0 0  PHQ - 2 Score 0 0 0  Altered sleeping 0 0 0  Tired, decreased energy 0 0 0  Change in appetite 0 0 0  Feeling bad or failure about yourself  0 0 0  Trouble concentrating 0 0 0  Moving slowly or fidgety/restless 0 0 0  Suicidal thoughts 0 0 0  PHQ-9 Score 0 0 0  Difficult doing work/chores Not difficult at all Not difficult at all Not difficult at all      08/27/2023   12:58 PM 04/06/2023    8:27 AM 07/06/2022   10:23 AM 05/01/2022    1:20 PM  GAD 7 : Generalized Anxiety Score  Nervous, Anxious, on Edge 0 0 0 0  Control/stop worrying 0 0 0 0  Worry too much - different things 0 0 0 0  Trouble relaxing 0 0 0 0  Restless 0 0 0 0  Easily annoyed or irritable 0 0 0 0  Afraid - awful might happen 0 0 0 0  Total GAD 7 Score 0 0 0 0  Anxiety Difficulty Not difficult at all Not difficult at all Not difficult at all Not difficult at all    No results found for this or any previous visit (from the past 2160 hours).   Assessment/ Plan: [place] GORMAN Pepper here for annual physical  exam.   Dyslipidemia - Plan: CMP14+EGFR, Lipid Panel, atorvastatin  (LIPITOR) 40 MG tablet  Pre-diabetes - Plan: CMP14+EGFR, Bayer DCA Hb A1c Waived  Obesity (BMI 30.0-34.9) - Plan: CMP14+EGFR, Bayer DCA Hb A1c Waived  Osteopenia of neck of left femur - Plan: CMP14+EGFR, VITAMIN D  25 Hydroxy (Vit-D Deficiency, Fractures)  Vitamin D  deficiency - Plan: CMP14+EGFR  Elevated MCV - Plan: CMP14+EGFR, CBC with Differential, Vitamin B12, Folate   Fasting labs collected today.  Medication renewed.  Discussed use of co-Q10 if having aches related to statin use though suspect this is more likely due to strain.  Okay to continue Tylenol as needed  We discussed recommendations for DEXA scan though she was not amenable to this today.  I gave her handout on ways to prevent osteoporosis as she has known osteopenia of the left femur based on previous DEXA scan.  Reinforced compliance with vitamin D  and calcium  supplementation and weightbearing exercise  Will check for B12 and folic acid deficiencies given mildly elevated MCV on last CBC  Additionally, tetanus shot not administered today due to potential for insurance not covering.  She will get this done at an outside pharmacy  Counseled on healthy lifestyle choices, including diet (rich in fruits, vegetables and lean meats and low in salt and simple carbohydrates) and exercise (at least 30 minutes of moderate physical activity daily).  Patient to follow up 1 year for CPE  Dmari Schubring M. Jolinda, DO

## 2024-08-29 ENCOUNTER — Ambulatory Visit: Payer: Self-pay | Admitting: Family Medicine

## 2024-08-29 LAB — CBC WITH DIFFERENTIAL/PLATELET
Basophils Absolute: 0.1 x10E3/uL (ref 0.0–0.2)
Basos: 1 %
EOS (ABSOLUTE): 0.2 x10E3/uL (ref 0.0–0.4)
Eos: 4 %
Hematocrit: 47.7 % — ABNORMAL HIGH (ref 34.0–46.6)
Hemoglobin: 15.8 g/dL (ref 11.1–15.9)
Immature Grans (Abs): 0 x10E3/uL (ref 0.0–0.1)
Immature Granulocytes: 0 %
Lymphocytes Absolute: 2.1 x10E3/uL (ref 0.7–3.1)
Lymphs: 33 %
MCH: 32.7 pg (ref 26.6–33.0)
MCHC: 33.1 g/dL (ref 31.5–35.7)
MCV: 99 fL — ABNORMAL HIGH (ref 79–97)
Monocytes Absolute: 0.5 x10E3/uL (ref 0.1–0.9)
Monocytes: 9 %
Neutrophils Absolute: 3.4 x10E3/uL (ref 1.4–7.0)
Neutrophils: 53 %
Platelets: 236 x10E3/uL (ref 150–450)
RBC: 4.83 x10E6/uL (ref 3.77–5.28)
RDW: 12.5 % (ref 11.7–15.4)
WBC: 6.3 x10E3/uL (ref 3.4–10.8)

## 2024-08-29 LAB — CMP14+EGFR
ALT: 26 IU/L (ref 0–32)
AST: 26 IU/L (ref 0–40)
Albumin: 4.1 g/dL (ref 3.8–4.8)
Alkaline Phosphatase: 104 IU/L (ref 49–135)
BUN/Creatinine Ratio: 19 (ref 12–28)
BUN: 14 mg/dL (ref 8–27)
Bilirubin Total: 0.7 mg/dL (ref 0.0–1.2)
CO2: 25 mmol/L (ref 20–29)
Calcium: 9.5 mg/dL (ref 8.7–10.3)
Chloride: 104 mmol/L (ref 96–106)
Creatinine, Ser: 0.73 mg/dL (ref 0.57–1.00)
Globulin, Total: 2.1 g/dL (ref 1.5–4.5)
Glucose: 111 mg/dL — ABNORMAL HIGH (ref 70–99)
Potassium: 4.2 mmol/L (ref 3.5–5.2)
Sodium: 142 mmol/L (ref 134–144)
Total Protein: 6.2 g/dL (ref 6.0–8.5)
eGFR: 86 mL/min/1.73 (ref >59)

## 2024-08-29 LAB — LIPID PANEL
Chol/HDL Ratio: 3 ratio (ref 0.0–4.4)
Cholesterol, Total: 164 mg/dL (ref 100–199)
HDL: 54 mg/dL (ref >39)
LDL Chol Calc (NIH): 84 mg/dL (ref 0–99)
Triglycerides: 148 mg/dL (ref 0–149)
VLDL Cholesterol Cal: 26 mg/dL (ref 5–40)

## 2024-08-29 LAB — FOLATE: Folate: >20 ng/mL (ref >3.0)

## 2024-08-29 LAB — VITAMIN D 25 HYDROXY (VIT D DEFICIENCY, FRACTURES): Vit D, 25-Hydroxy: 37 ng/mL (ref 30.0–100.0)

## 2024-08-29 LAB — VITAMIN B12: Vitamin B-12: 591 pg/mL (ref 232–1245)

## 2024-08-30 LAB — HGB A1C W/O EAG: Hgb A1c MFr Bld: 5.2 % (ref 4.8–5.6)

## 2024-08-30 LAB — SPECIMEN STATUS REPORT

## 2024-09-01 NOTE — Telephone Encounter (Signed)
 Patient reviewed on MyChart, no questions.

## 2024-10-24 ENCOUNTER — Ambulatory Visit: Payer: Self-pay

## 2024-12-04 ENCOUNTER — Ambulatory Visit
